# Patient Record
Sex: Male | Born: 1940 | Race: Black or African American | Hispanic: No | Marital: Married | Smoking: Former smoker
Health system: Southern US, Community
[De-identification: ages and names within clinical notes are randomized; demographics above are authoritative.]

## PROBLEM LIST (undated history)

## (undated) DIAGNOSIS — F419 Anxiety disorder, unspecified: Secondary | ICD-10-CM

## (undated) DIAGNOSIS — R634 Abnormal weight loss: Secondary | ICD-10-CM

## (undated) DIAGNOSIS — R630 Anorexia: Secondary | ICD-10-CM

## (undated) DIAGNOSIS — K5732 Diverticulitis of large intestine without perforation or abscess without bleeding: Secondary | ICD-10-CM

## (undated) DIAGNOSIS — J45909 Unspecified asthma, uncomplicated: Secondary | ICD-10-CM

## (undated) DIAGNOSIS — R197 Diarrhea, unspecified: Secondary | ICD-10-CM

## (undated) DIAGNOSIS — F32A Depression, unspecified: Secondary | ICD-10-CM

## (undated) DIAGNOSIS — C799 Secondary malignant neoplasm of unspecified site: Secondary | ICD-10-CM

## (undated) DIAGNOSIS — R51 Headache: Secondary | ICD-10-CM

## (undated) DIAGNOSIS — F329 Major depressive disorder, single episode, unspecified: Secondary | ICD-10-CM

## (undated) DIAGNOSIS — M1990 Unspecified osteoarthritis, unspecified site: Secondary | ICD-10-CM

## (undated) DIAGNOSIS — K219 Gastro-esophageal reflux disease without esophagitis: Secondary | ICD-10-CM

## (undated) DIAGNOSIS — R39198 Other difficulties with micturition: Secondary | ICD-10-CM

## (undated) DIAGNOSIS — R519 Headache, unspecified: Secondary | ICD-10-CM

## (undated) HISTORY — DX: Headache: R51

## (undated) HISTORY — DX: Diverticulitis of large intestine without perforation or abscess without bleeding: K57.32

## (undated) HISTORY — DX: Headache, unspecified: R51.9

## (undated) HISTORY — DX: Unspecified osteoarthritis, unspecified site: M19.90

## (undated) HISTORY — PX: TONSILLECTOMY: SUR1361

## (undated) HISTORY — DX: Abnormal weight loss: R63.4

## (undated) HISTORY — DX: Diarrhea, unspecified: R19.7

## (undated) HISTORY — DX: Other difficulties with micturition: R39.198

---

## 1977-11-16 HISTORY — PX: LAPAROSCOPIC GASTROTOMY W/ REPAIR OF ULCER: SUR772

## 2003-04-09 ENCOUNTER — Encounter: Payer: Self-pay | Admitting: Family Medicine

## 2003-04-09 ENCOUNTER — Encounter: Admission: RE | Admit: 2003-04-09 | Discharge: 2003-04-09 | Payer: Self-pay | Admitting: Family Medicine

## 2004-09-16 ENCOUNTER — Ambulatory Visit: Payer: Self-pay | Admitting: Gastroenterology

## 2005-08-12 ENCOUNTER — Emergency Department (HOSPITAL_COMMUNITY): Admission: EM | Admit: 2005-08-12 | Discharge: 2005-08-12 | Payer: Self-pay | Admitting: Family Medicine

## 2005-08-20 ENCOUNTER — Emergency Department (HOSPITAL_COMMUNITY): Admission: EM | Admit: 2005-08-20 | Discharge: 2005-08-20 | Payer: Self-pay | Admitting: Family Medicine

## 2009-09-11 ENCOUNTER — Encounter (INDEPENDENT_AMBULATORY_CARE_PROVIDER_SITE_OTHER): Payer: Self-pay | Admitting: *Deleted

## 2010-06-03 ENCOUNTER — Telehealth: Payer: Self-pay | Admitting: Gastroenterology

## 2010-07-07 ENCOUNTER — Encounter (INDEPENDENT_AMBULATORY_CARE_PROVIDER_SITE_OTHER): Payer: Self-pay | Admitting: *Deleted

## 2010-07-09 ENCOUNTER — Ambulatory Visit: Payer: Self-pay | Admitting: Gastroenterology

## 2010-12-18 NOTE — Progress Notes (Signed)
Summary: Schedule Colonoscopy  Phone Note Outgoing Call Call back at [facility] Phone [phone]   Call placed by: Harlow Mares CMA Duncan Dull),  June 03, 2010 10:54 AM Call placed to: Patient Summary of Call: Left message on patients machine to call back. patient is due for a colonoscopy  Initial call taken by: Harlow Mares CMA Duncan Dull),  June 03, 2010 10:54 AM  Follow-up for Phone Call        Patient is scheduled for 07-23-10 Follow-up by: es, pcc

## 2010-12-18 NOTE — Miscellaneous (Signed)
Summary: [facility] Previsit/prep  Clinical Lists Changes  Medications: Added new medication of MOVIPREP 100 GM  SOLR (PEG-KCL-NACL-NASULF-NA ASC-C) As per prep instructions. - Signed Rx of MOVIPREP 100 GM  SOLR (PEG-KCL-NACL-NASULF-NA ASC-C) As per prep instructions.;  #1 x 0;  Signed;  Entered by: Wyona Almas RN;  Authorized by: Louis Meckel MD;  Method used: Electronically to [id number] [address], [place], [place], [place]  [address], Ph: [phone], Fax: [phone] Observations: Added new observation of NKA: T (07/09/2010 10:16)    Prescriptions: MOVIPREP 100 GM  SOLR (PEG-KCL-NACL-NASULF-NA ASC-C) As per prep instructions.  #1 x 0   Entered by:   Wyona Almas RN   Authorized by:   Louis Meckel MD   Signed by:   Wyona Almas RN on 07/09/2010   Method used:   [facility]ronically to        [facility] [address] [id number] ([facility]       [address]       [place], [place]  [address]       Ph: [phone]       Fax: [phone]   RxID:   [id number]

## 2010-12-18 NOTE — Letter (Signed)
Summary: [facility] Instructions  [facility]  [address] [place], [place] [address]   Phone: [phone]  Fax: [phone]       RICHARDS PHERIGO    14-Oct-1941    MRN: [id number]        Procedure Day Dorna Bloom:  [facility]  07/23/10     Arrival Time:  10:30AM      Procedure Time:  11:30AM     Location of Procedure:                    _X _  [facility] (4th Floor)                       PREPARATION FOR COLONOSCOPY WITH MOVIPREP   Starting 5 days prior to your procedure 07/18/10 do not eat nuts, seeds, popcorn, corn, beans, peas,  salads, or any raw vegetables.  Do not take any fiber supplements (e.g. Metamucil, Citrucel, and Benefiber).  THE DAY BEFORE YOUR PROCEDURE         DATE: 07/22/10  DAY: TUESDAY  1.  Drink clear liquids the entire day-NO SOLID FOOD  2.  Do not drink anything colored red or purple.  Avoid juices with pulp.  No orange juice.  3.  Drink at least 64 oz. (8 glasses) of fluid/clear liquids during the day to prevent dehydration and help the prep work efficiently.  CLEAR LIQUIDS INCLUDE: Water Jello Ice Popsicles Tea (sugar ok, no milk/cream) Powdered fruit flavored drinks Coffee (sugar ok, no milk/cream) Gatorade Juice: apple, white grape, white cranberry  Lemonade Clear bullion, consomm, broth Carbonated beverages (any kind) Strained chicken noodle soup Hard Candy                             4.  In the morning, mix first dose of MoviPrep solution:    Empty 1 Pouch A and 1 Pouch B into the disposable container    Add lukewarm drinking water to the top line of the container. Mix to dissolve    Refrigerate (mixed solution should be used within 24 hrs)  5.  Begin drinking the prep at 5:00 p.m. The MoviPrep container is divided by 4 marks.   Every 15 minutes drink the solution down to the next mark (approximately 8 oz) until the full liter is complete.   6.  Follow completed prep with 16 oz of clear liquid of your choice  (Nothing red or purple).  Continue to drink clear liquids until bedtime.  7.  Before going to bed, mix second dose of MoviPrep solution:    Empty 1 Pouch A and 1 Pouch B into the disposable container    Add lukewarm drinking water to the top line of the container. Mix to dissolve    Refrigerate  THE DAY OF YOUR PROCEDURE      DATE: 07/23/10   DAY: WEDNESDAY  Beginning at 6:30AM (5 hours before procedure):         1. Every 15 minutes, drink the solution down to the next mark (approx 8 oz) until the full liter is complete.  2. Follow completed prep with 16 oz. of clear liquid of your choice.    3. You may drink clear liquids until 9:30AM (2 HOURS BEFORE PROCEDURE).   MEDICATION INSTRUCTIONS  Unless otherwise instructed, you should take regular prescription medications with a small sip of water   as early as possible the  morning of your procedure.       OTHER INSTRUCTIONS  You will need a responsible adult at least 69 years of age to accompany you and drive you home.   This person must remain in the waiting room during your procedure.  Wear loose fitting clothing that is easily removed.  Leave jewelry and other valuables at home.  However, you may wish to bring a book to read or  an iPod/MP3 player to listen to music as you wait for your procedure to start.  Remove all body piercing jewelry and leave at home.  Total time from sign-in until discharge is approximately 2-3 hours.  You should go home directly after your procedure and rest.  You can resume normal activities the  day after your procedure.  The day of your procedure you should not:   Drive   Make legal decisions   Operate machinery   Drink alcohol   Return to work  You will receive specific instructions about eating, activities and medications before you leave.    The above instructions have been reviewed and explained to me by  Wyona Almas RN  July 09, 2010 10:54 AM     I fully understand  and can verbalize these instructions _____________________________ Date _________

## 2011-10-29 ENCOUNTER — Emergency Department (HOSPITAL_COMMUNITY)
Admission: EM | Admit: 2011-10-29 | Discharge: 2011-10-30 | Disposition: A | Discharge status: Home / Self Care | Payer: Medicare Other | Attending: Emergency Medicine | Admitting: Emergency Medicine

## 2011-10-29 ENCOUNTER — Encounter: Payer: Self-pay | Admitting: *Deleted

## 2011-10-29 DIAGNOSIS — R109 Unspecified abdominal pain: Secondary | ICD-10-CM | POA: Insufficient documentation

## 2011-10-29 DIAGNOSIS — M545 Low back pain, unspecified: Secondary | ICD-10-CM | POA: Insufficient documentation

## 2011-10-29 DIAGNOSIS — N200 Calculus of kidney: Secondary | ICD-10-CM

## 2011-10-29 DIAGNOSIS — R112 Nausea with vomiting, unspecified: Secondary | ICD-10-CM | POA: Insufficient documentation

## 2011-10-29 DIAGNOSIS — K5900 Constipation, unspecified: Secondary | ICD-10-CM | POA: Insufficient documentation

## 2011-10-29 DIAGNOSIS — Z79899 Other long term (current) drug therapy: Secondary | ICD-10-CM | POA: Insufficient documentation

## 2011-10-29 DIAGNOSIS — F341 Dysthymic disorder: Secondary | ICD-10-CM | POA: Insufficient documentation

## 2011-10-29 DIAGNOSIS — N201 Calculus of ureter: Secondary | ICD-10-CM | POA: Insufficient documentation

## 2011-10-29 HISTORY — DX: Anxiety disorder, unspecified: F41.9

## 2011-10-29 HISTORY — DX: Depression, unspecified: F32.A

## 2011-10-29 HISTORY — DX: Major depressive disorder, single episode, unspecified: F32.9

## 2011-10-29 NOTE — ED Notes (Signed)
Pt in c/o left flank pain and lower abd pain, also n/v

## 2011-10-30 ENCOUNTER — Emergency Department (HOSPITAL_COMMUNITY): Payer: Medicare Other

## 2011-10-30 LAB — URINALYSIS, ROUTINE W REFLEX MICROSCOPIC
Glucose, UA: NEGATIVE mg/dL
Ketones, ur: NEGATIVE mg/dL
Leukocytes, UA: NEGATIVE
Nitrite: NEGATIVE
Specific Gravity, Urine: 1.025 (ref 1.005–1.030)
pH: 5.5 (ref 5.0–8.0)

## 2011-10-30 LAB — URINE MICROSCOPIC-ADD ON

## 2011-10-30 MED ORDER — OXYCODONE-ACETAMINOPHEN 5-325 MG PO TABS
1.0000 | ORAL_TABLET | Freq: Once | ORAL | Status: AC
  Administered 2011-10-30: 1 via ORAL
  Filled 2011-10-30: qty 1

## 2011-10-30 MED ORDER — ONDANSETRON HCL 4 MG PO TABS: 4.0000 mg | ORAL_TABLET | Freq: Four times a day (QID) | ORAL | Status: AC

## 2011-10-30 MED ORDER — OXYCODONE-ACETAMINOPHEN 5-325 MG PO TABS: 2.0000 | ORAL_TABLET | ORAL | Status: AC | PRN

## 2011-10-30 MED ORDER — CIPROFLOXACIN HCL 500 MG PO TABS: 500.0000 mg | ORAL_TABLET | Freq: Two times a day (BID) | ORAL | Status: AC

## 2011-10-30 NOTE — ED Provider Notes (Cosign Needed)
History     CSN: [id number] Arrival date & time: 10/29/2011 10:42 PM   First MD Initiated Contact with Patient 10/30/11 0051      Chief Complaint  Patient presents with  . Abdominal Pain  . Flank Pain   very pleasant, gentleman, avid golfer, reports left flank pain for 4-5 days. He has an appointment with his doctor in [facility] tomorrow, but states he "couldn't wait" he did take tramadol for the pain. He also reports being constipated for the past 2 days, but has not taken any over-the-counter remedies. He has had some nausea. No hematuria or dysuria. No numbness, weakness or tingling. No chest pain.  (Consider location/radiation/quality/duration/timing/severity/associated sxs/prior treatment) HPI  Past Medical History  Diagnosis Date  . Depression   . Anxiety     History reviewed. No pertinent past surgical history.  History reviewed. No pertinent family history.  History  Substance Use Topics  . Smoking status: Never Smoker   . Smokeless tobacco: Not on file  . Alcohol Use: No      Review of Systems  All other systems reviewed and are negative.    Allergies  Review of patient's allergies indicates no known allergies.  Home Medications   Current Outpatient Rx  Name Route Sig Dispense Refill  . BUPROPION HCL ER (SR) 150 MG PO TB12 Oral Take 150 mg by mouth 2 (two) times daily.      Marland Kitchen DOXAZOSIN MESYLATE 8 MG PO TABS Oral Take 4 mg by mouth at bedtime.      Marland Kitchen DOXEPIN HCL 50 MG PO CAPS Oral Take 50 mg by mouth at bedtime.      Marland Kitchen PANTOPRAZOLE SODIUM 40 MG PO TBEC Oral Take 40 mg by mouth daily.      . TRAMADOL HCL 50 MG PO TABS Oral Take 50 mg by mouth 3 (three) times daily as needed. Maximum dose= 8 tablets per day       BP 122/72  Pulse 69  Temp(Src) 98.7 F (37.1 C) (Oral)  Resp 20  SpO2 99%  Physical Exam  Nursing note and vitals reviewed. Constitutional: He is oriented to person, place, and time. He appears well-developed and well-nourished.    HENT:  Head: Normocephalic and atraumatic.  Eyes: Conjunctivae and EOM are normal. Pupils are equal, round, and reactive to light.  Neck: Neck supple.  Cardiovascular: Normal rate and regular rhythm.  Exam reveals no gallop and no friction rub.   No murmur heard. Pulmonary/Chest: Breath sounds normal. He has no wheezes. He has no rales. He exhibits no tenderness.  Abdominal: Soft. Bowel sounds are normal. He exhibits no distension. There is no tenderness. There is no rebound and no guarding.  Musculoskeletal: Normal range of motion.       Diffuse tenderness to left lower  paralumbar spine and CVA tenderness. No swelling or ecchymosis.  Neurological: He is alert and oriented to person, place, and time. No cranial nerve deficit. Coordination normal.  Skin: Skin is warm and dry. No rash noted.  Psychiatric: He has a normal mood and affect.    ED Course  Procedures (including critical care time)  Labs Reviewed  URINALYSIS, ROUTINE W REFLEX MICROSCOPIC - Abnormal; Notable for the following:    Hgb urine dipstick SMALL (*)    All other components within normal limits  URINE MICROSCOPIC-ADD ON - Abnormal; Notable for the following:    Crystals URIC ACID CRYSTALS (*)    All other components within normal limits   Ct Abdomen  Pelvis Wo Contrast  10/30/2011  *RADIOLOGY REPORT*  Clinical Data: Left flank pain.  Lower abdominal pain.  Nausea and vomiting.  CT ABDOMEN AND PELVIS WITHOUT CONTRAST  Technique:  Multidetector CT imaging of the abdomen and pelvis was performed following the standard protocol without intravenous contrast.  Comparison: None.  Findings: Atelectasis in the lung bases.  Small left pleural effusion.  4 mm stone in the left ureterovesicle junction with proximal ureterectasis and pyelocaliectasis as well as periureteral and pararenal stranding.  Changes are consistent with moderate obstruction of the kidney and ureter.  There are multiple additional intrarenal stones in the left  kidney.  The right renal collecting system and ureter are decompressed and no right renal or ureteral or bladder stones are visualized.  Surgical clips in the EG junction.  Unenhanced appearance of the liver, spleen, gallbladder, pancreas, adrenal glands, and retroperitoneal lymph nodes is unremarkable.  The stomach and small bowel are decompressed.  Calcification of the abdominal aorta without aneurysm.  Stool filled colon with scattered diverticula. No inflammatory changes suggested.  No significant free fluid or free air in the abdomen.  Anterior midline abdominal wall defects above the emboli this containing fat.  Pelvis:  The appendix is normal.  Diverticulosis of the sigmoid colon without inflammatory process.  There is a focal short segment area of thickening of the wall of the sigmoid colon which could be due to peristalsis but does appear to demonstrates some shouldering.  Colon carcinoma should be excluded.  Mild prostate enlargement.  Calcified phleboliths.  Normal alignment of the lumbar vertebra.  IMPRESSION: 4 mm moderately obstructing stone in the distal left ureter with proximal pyelocaliectasis and ureterectasis, periureteral stranding, and pararenal stranding.  Small left pleural effusion may be reactive.  Focal short segment area of wall thickening in the sigmoid colon is suspicious for possible colon cancer.  Colonic evaluation is recommended.  Original Report Authenticated By: Marlon Pel, M.D.     No diagnosis found.    MDM  Pt is seen and examined;  Initial history and physical completed.  Will follow.      Results for orders placed during the hospital encounter of 10/29/11  URINALYSIS, ROUTINE W REFLEX MICROSCOPIC      Component Value Range   Color, Urine YELLOW  YELLOW    APPearance CLEAR  CLEAR    Specific Gravity, Urine 1.025  1.005 - 1.030    pH 5.5  5.0 - 8.0    Glucose, UA NEGATIVE  NEGATIVE (mg/dL)   Hgb urine dipstick SMALL (*) NEGATIVE    Bilirubin Urine  NEGATIVE  NEGATIVE    Ketones, ur NEGATIVE  NEGATIVE (mg/dL)   Protein, ur NEGATIVE  NEGATIVE (mg/dL)   Urobilinogen, UA 0.2  0.0 - 1.0 (mg/dL)   Nitrite NEGATIVE  NEGATIVE    Leukocytes, UA NEGATIVE  NEGATIVE   URINE MICROSCOPIC-ADD ON      Component Value Range   Squamous Epithelial / LPF RARE  RARE    WBC, UA 0-2  <3 (WBC/hpf)   RBC / HPF 3-6  <3 (RBC/hpf)   Crystals URIC ACID CRYSTALS (*) NEGATIVE    Urine-Other MUCOUS PRESENT     No results found.   4:06 AM  Long delay in CT scan, still pending.   4:06 AM  CT scan discussed in detail with patient and the spouse. Specifically 4, showing a 4 mm distal left ureteral stone. Also, small left pleural effusion, which may be reactive. There was also a short  segment of wall thickening in the sigmoid colon, which the radiologist had mentioned, was suspicious for "possible colon cancer. Patient states he just had a normal colonoscopy. Several weeks ago. However, he was instructed that it would be imperative that he discuss his CAT scan findings with his primary care physician and take appropriate steps to followup with [place] as needed. Patient states he would be seeing his primary doctor, tomorrow and will discuss things further   Theron Arista A. Patrica Duel, MD 10/30/11 1610

## 2011-10-30 NOTE — ED Notes (Signed)
Returned from CT.

## 2011-10-30 NOTE — ED Notes (Signed)
Waiting for CT scan

## 2012-07-13 ENCOUNTER — Inpatient Hospital Stay (HOSPITAL_COMMUNITY)
Admission: EM | Admit: 2012-07-13 | Discharge: 2012-07-17 | DRG: 392 | Disposition: A | Discharge status: Home / Self Care | Payer: Medicare Other | Attending: Surgery | Admitting: Surgery

## 2012-07-13 ENCOUNTER — Encounter (HOSPITAL_COMMUNITY): Payer: Self-pay | Admitting: Family Medicine

## 2012-07-13 ENCOUNTER — Emergency Department (HOSPITAL_COMMUNITY): Payer: Medicare Other

## 2012-07-13 DIAGNOSIS — K631 Perforation of intestine (nontraumatic): Secondary | ICD-10-CM

## 2012-07-13 DIAGNOSIS — K630 Abscess of intestine: Secondary | ICD-10-CM | POA: Diagnosis present

## 2012-07-13 DIAGNOSIS — K219 Gastro-esophageal reflux disease without esophagitis: Secondary | ICD-10-CM | POA: Diagnosis present

## 2012-07-13 DIAGNOSIS — Z79899 Other long term (current) drug therapy: Secondary | ICD-10-CM

## 2012-07-13 DIAGNOSIS — K5780 Diverticulitis of intestine, part unspecified, with perforation and abscess without bleeding: Secondary | ICD-10-CM | POA: Diagnosis present

## 2012-07-13 DIAGNOSIS — K5732 Diverticulitis of large intestine without perforation or abscess without bleeding: Secondary | ICD-10-CM

## 2012-07-13 DIAGNOSIS — F3289 Other specified depressive episodes: Secondary | ICD-10-CM | POA: Diagnosis present

## 2012-07-13 DIAGNOSIS — F329 Major depressive disorder, single episode, unspecified: Secondary | ICD-10-CM | POA: Diagnosis present

## 2012-07-13 DIAGNOSIS — F411 Generalized anxiety disorder: Secondary | ICD-10-CM | POA: Diagnosis present

## 2012-07-13 DIAGNOSIS — K5720 Diverticulitis of large intestine with perforation and abscess without bleeding: Secondary | ICD-10-CM

## 2012-07-13 LAB — CBC WITH DIFFERENTIAL/PLATELET
Basophils Absolute: 0 10*3/uL (ref 0.0–0.1)
HCT: 38.3 % — ABNORMAL LOW (ref 39.0–52.0)
Lymphocytes Relative: 13 % (ref 12–46)
Monocytes Absolute: 0.8 10*3/uL (ref 0.1–1.0)
Neutro Abs: 6.5 10*3/uL (ref 1.7–7.7)
Neutrophils Relative %: 77 % (ref 43–77)
Platelets: 271 10*3/uL (ref 150–400)
RDW: 12.7 % (ref 11.5–15.5)
WBC: 8.5 10*3/uL (ref 4.0–10.5)

## 2012-07-13 LAB — POCT I-STAT, CHEM 8
BUN: 11 mg/dL (ref 6–23)
Chloride: 104 mEq/L (ref 96–112)
Potassium: 3.6 mEq/L (ref 3.5–5.1)
Sodium: 142 mEq/L (ref 135–145)

## 2012-07-13 LAB — URINALYSIS, ROUTINE W REFLEX MICROSCOPIC
Nitrite: NEGATIVE
Specific Gravity, Urine: 1.032 — ABNORMAL HIGH (ref 1.005–1.030)
pH: 5.5 (ref 5.0–8.0)

## 2012-07-13 LAB — GLUCOSE, CAPILLARY

## 2012-07-13 MED ORDER — DIPHENHYDRAMINE HCL 50 MG/ML IJ SOLN: 12.5000 mg | Freq: Four times a day (QID) | INTRAMUSCULAR | Status: DC | PRN

## 2012-07-13 MED ORDER — DOXAZOSIN MESYLATE 4 MG PO TABS
4.0000 mg | ORAL_TABLET | Freq: Every day | ORAL | Status: DC
  Administered 2012-07-13 – 2012-07-16 (×4): 4 mg via ORAL
  Filled 2012-07-13 (×5): qty 1

## 2012-07-13 MED ORDER — ENOXAPARIN SODIUM 40 MG/0.4ML ~~LOC~~ SOLN
40.0000 mg | SUBCUTANEOUS | Status: DC
  Administered 2012-07-14 – 2012-07-16 (×3): 40 mg via SUBCUTANEOUS
  Filled 2012-07-13 (×6): qty 0.4

## 2012-07-13 MED ORDER — KCL IN DEXTROSE-NACL 20-5-0.45 MEQ/L-%-% IV SOLN
INTRAVENOUS | Status: DC
  Administered 2012-07-13: 22:00:00 via INTRAVENOUS
  Administered 2012-07-14: 100 mL/h via INTRAVENOUS
  Administered 2012-07-15 (×2): via INTRAVENOUS
  Filled 2012-07-13 (×8): qty 1000

## 2012-07-13 MED ORDER — CIPROFLOXACIN IN D5W 400 MG/200ML IV SOLN
400.0000 mg | Freq: Two times a day (BID) | INTRAVENOUS | Status: DC
  Administered 2012-07-13 – 2012-07-16 (×7): 400 mg via INTRAVENOUS
  Filled 2012-07-13 (×8): qty 200

## 2012-07-13 MED ORDER — ERTAPENEM SODIUM 1 G IJ SOLR
1.0000 g | Freq: Once | INTRAMUSCULAR | Status: AC
  Administered 2012-07-13: 1 g via INTRAVENOUS
  Filled 2012-07-13: qty 1

## 2012-07-13 MED ORDER — ONDANSETRON HCL 4 MG/2ML IJ SOLN: 4.0000 mg | Freq: Four times a day (QID) | INTRAMUSCULAR | Status: DC | PRN

## 2012-07-13 MED ORDER — MORPHINE SULFATE 2 MG/ML IJ SOLN
1.0000 mg | INTRAMUSCULAR | Status: DC | PRN
  Administered 2012-07-14 – 2012-07-15 (×3): 2 mg via INTRAVENOUS
  Filled 2012-07-13 (×4): qty 1

## 2012-07-13 MED ORDER — ACETAMINOPHEN 650 MG RE SUPP: 650.0000 mg | Freq: Four times a day (QID) | RECTAL | Status: DC | PRN

## 2012-07-13 MED ORDER — PANTOPRAZOLE SODIUM 40 MG IV SOLR
40.0000 mg | Freq: Every day | INTRAVENOUS | Status: DC
  Administered 2012-07-13 – 2012-07-16 (×4): 40 mg via INTRAVENOUS
  Filled 2012-07-13 (×5): qty 40

## 2012-07-13 MED ORDER — METRONIDAZOLE IN NACL 5-0.79 MG/ML-% IV SOLN
500.0000 mg | Freq: Three times a day (TID) | INTRAVENOUS | Status: DC
  Administered 2012-07-13 – 2012-07-17 (×11): 500 mg via INTRAVENOUS
  Filled 2012-07-13 (×12): qty 100

## 2012-07-13 MED ORDER — DIPHENHYDRAMINE HCL 12.5 MG/5ML PO ELIX: 12.5000 mg | ORAL_SOLUTION | Freq: Four times a day (QID) | ORAL | Status: DC | PRN

## 2012-07-13 MED ORDER — ACETAMINOPHEN 325 MG PO TABS
650.0000 mg | ORAL_TABLET | Freq: Four times a day (QID) | ORAL | Status: DC | PRN
  Administered 2012-07-13 – 2012-07-16 (×2): 650 mg via ORAL
  Filled 2012-07-13 (×4): qty 2

## 2012-07-13 MED ORDER — DOXEPIN HCL 50 MG PO CAPS
50.0000 mg | ORAL_CAPSULE | Freq: Every day | ORAL | Status: DC
  Administered 2012-07-13 – 2012-07-16 (×4): 50 mg via ORAL
  Filled 2012-07-13 (×5): qty 1

## 2012-07-13 NOTE — ED Notes (Addendum)
Pt reports L flank and abd pain since yesterday with worse pain today. Pt denies dysuria or constipation, states last BM today.

## 2012-07-13 NOTE — ED Notes (Signed)
Patient transported to CT 

## 2012-07-13 NOTE — H&P (Signed)
Daniel Huynh is an 71 y.o. male.   Chief Complaint: left flank/abdominal pain. HPI:  Pt is a 71 year old male with 2 weeks of left flank/abdominal pain.  It was mild until 2 days ago.  It became much more severe.  He has not had much of an appetite.  The pain felt like the kidney stone he had last year.  He denies hematuria.  He has had a colonoscopy within the last 5 years.  He does not recall being told he had diverticuli.  He denies fevers/ chills.  The pain when he came in was very severe.  It was not relieved by BM.    Past Medical History  Diagnosis Date  . Depression   . Anxiety     History reviewed. No pertinent past surgical history.  History reviewed. No pertinent family history. Social History:  reports that he has never smoked. He does not have any smokeless tobacco history on file. He reports that he does not drink alcohol or use illicit drugs.  Allergies:  Allergies  Allergen Reactions  . Tylenol With Codeine #3 (Acetaminophen-Codeine) Rash     (Not in a hospital admission)  Results for orders placed during the hospital encounter of 07/13/12 (from the past 48 hour(s))  URINALYSIS, ROUTINE W REFLEX MICROSCOPIC     Status: Abnormal   Collection Time   07/13/12  3:38 PM      Component Value Range Comment   Color, Urine AMBER (*) YELLOW BIOCHEMICALS MAY BE AFFECTED BY COLOR   APPearance CLEAR  CLEAR    Specific Gravity, Urine 1.032 (*) 1.005 - 1.030    pH 5.5  5.0 - 8.0    Glucose, UA NEGATIVE  NEGATIVE mg/dL    Hgb urine dipstick NEGATIVE  NEGATIVE    Bilirubin Urine SMALL (*) NEGATIVE    Ketones, ur NEGATIVE  NEGATIVE mg/dL    Protein, ur NEGATIVE  NEGATIVE mg/dL    Urobilinogen, UA 1.0  0.0 - 1.0 mg/dL    Nitrite NEGATIVE  NEGATIVE    Leukocytes, UA NEGATIVE  NEGATIVE MICROSCOPIC NOT DONE ON URINES WITH NEGATIVE PROTEIN, BLOOD, LEUKOCYTES, NITRITE, OR GLUCOSE <1000 mg/dL.  CBC WITH DIFFERENTIAL     Status: Abnormal   Collection Time   07/13/12  3:45 PM   Component Value Range Comment   WBC 8.5  4.0 - 10.5 K/uL    RBC 4.63  4.22 - 5.81 MIL/uL    Hemoglobin 12.8 (*) 13.0 - 17.0 g/dL    HCT 09.8 (*) 11.9 - 52.0 %    MCV 82.7  78.0 - 100.0 fL    MCH 27.6  26.0 - 34.0 pg    MCHC 33.4  30.0 - 36.0 g/dL    RDW 14.7  82.9 - 56.2 %    Platelets 271  150 - 400 K/uL    Neutrophils Relative 77  43 - 77 %    Neutro Abs 6.5  1.7 - 7.7 K/uL    Lymphocytes Relative 13  12 - 46 %    Lymphs Abs 1.1  0.7 - 4.0 K/uL    Monocytes Relative 10  3 - 12 %    Monocytes Absolute 0.8  0.1 - 1.0 K/uL    Eosinophils Relative 1  0 - 5 %    Eosinophils Absolute 0.0  0.0 - 0.7 K/uL    Basophils Relative 0  0 - 1 %    Basophils Absolute 0.0  0.0 - 0.1 K/uL  GLUCOSE, CAPILLARY     Status: Abnormal   Collection Time   07/13/12  3:51 PM      Component Value Range Comment   Glucose-Capillary 135 (*) 70 - 99 mg/dL    Comment 1 Notify RN     POCT I-STAT, CHEM 8     Status: Abnormal   Collection Time   07/13/12  4:32 PM      Component Value Range Comment   Sodium 142  135 - 145 mEq/L    Potassium 3.6  3.5 - 5.1 mEq/L    Chloride 104  96 - 112 mEq/L    BUN 11  6 - 23 mg/dL    Creatinine, Ser 5.64  0.50 - 1.35 mg/dL    Glucose, Bld 332 (*) 70 - 99 mg/dL    Calcium, Ion 9.51  8.84 - 1.30 mmol/L    TCO2 26  0 - 100 mmol/L    Hemoglobin 13.6  13.0 - 17.0 g/dL    HCT 16.6  06.3 - 01.6 %    Ct Abdomen Pelvis Wo Contrast  07/13/2012  *RADIOLOGY REPORT*  Clinical Data: Left flank pain.  Worsening pain today.  CT ABDOMEN AND PELVIS WITHOUT CONTRAST  Technique:  Multidetector CT imaging of the abdomen and pelvis was performed following the standard protocol without intravenous contrast.  Comparison: 10/30/2011.  Findings: Lung bases demonstrate dependent atelectasis.  Probable bilateral trace pleural effusions.  Postoperative changes are noted at the gastroesophageal junction, likely for reflux surgery.  Small amount of anterior pericardial fluid or thickening. Unenhanced CT  was performed per clinician order.  Lack of IV contrast limits sensitivity and specificity, especially for evaluation of abdominal/pelvic solid viscera.  Liver grossly normal.  High density material is present in the dependent gallbladder, likely representing biliary sludge.  Adrenal glands appear normal.  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  Bilateral nonobstructing renal calculi are present.  The ureters appear normal.  Aortoiliac atherosclerosis.  Urinary bladder normal.  Prostatomegaly.  There are marked inflammatory changes in the left mid abdomen secondary to diverticulitis.  There is a posterior contained perforation (image number 32 series 2).  Phlegmon surrounds this area in the retroperitoneal fat. Area of probable extraluminal gas and feculent material measures 23 mm x 12 mm on image number 32. This is larger than typical colonic diverticula.  Tiny area of apparently extraluminal retroperitoneal gas is present on image number 41 as well.  There is no intra-abdominal free air.  The distal colon demonstrates diverticulosis without diverticulitis.  Small bowel grossly appears within normal limits.  No aggressive osseous lesions.  IMPRESSION: 1.  Descending colonic diverticulitis.  Contained perforation into the posterior retroperitoneal fat with surrounding phlegmon. 2.  Bilateral nonobstructing renal collecting system calculi. 3.  Postsurgical changes at the gastroesophageal junction. 4.  Atherosclerosis.  These results were called by telephone on 07/13/2012 at 1720 hours to physician's assistant [facility], who verbally acknowledged these results.   Original Report Authenticated By: Andreas Newport, M.D.     Review of Systems  Constitutional: Negative for fever, chills and diaphoresis.  HENT: Negative.   Eyes: Negative.   Respiratory: Negative.   Cardiovascular: Negative.   Gastrointestinal:  Positive for abdominal pain (L flank). Negative for nausea, vomiting, diarrhea, constipation and blood in stool.  Genitourinary: Positive for flank pain.  Musculoskeletal: Negative.   Skin: Negative.   Neurological: Negative.   Endo/Heme/Allergies: Negative.  Psychiatric/Behavioral: Negative.     Blood pressure 134/88, pulse 78, temperature 98.9 F (37.2 C), temperature source Oral, resp. rate 24, SpO2 98.00%. Physical Exam   Assessment/Plan Acute diverticulitis with contained perforation and abscess.  Bowel rest. IVF IV antbiotics IR consult for perc drain. Hope to resolve without acute surgical intervention.  The possible courses of diverticulitis were reviewed with the patient.    Ritchie Klee 07/13/2012, 8:38 PM

## 2012-07-13 NOTE — ED Provider Notes (Signed)
History     CSN: [id number]  Arrival date & time 07/13/12  1505   First MD Initiated Contact with Patient 07/13/12 1537      Chief Complaint  Patient presents with  . Abdominal Pain    (Consider location/radiation/quality/duration/timing/severity/associated sxs/prior treatment) HPI Comments: Daniel Huynh is a 71 y.o. Male who presents with left flank pain for several days. States pain radiates into left lower abdomen. No pain with uriantion. No hematuria. No n/v/d. Last BM today and normal, loose. Hx of same pain last year, was diagnosed with a kidney stone. No fever, chills, malaise. Pain is colicky, comes and goes.   The history is provided by the patient.    Past Medical History  Diagnosis Date  . Depression   . Anxiety     History reviewed. No pertinent past surgical history.  History reviewed. No pertinent family history.  History  Substance Use Topics  . Smoking status: Never Smoker   . Smokeless tobacco: Not on file  . Alcohol Use: No      Review of Systems  Constitutional: Negative for fever and chills.  HENT: Negative for neck pain and neck stiffness.   Respiratory: Negative.   Cardiovascular: Negative.   Gastrointestinal: Positive for abdominal pain. Negative for nausea and vomiting.  Genitourinary: Positive for flank pain. Negative for dysuria, urgency, frequency and hematuria.  Musculoskeletal: Positive for back pain.  Skin: Negative.   Neurological: Negative for dizziness, weakness, light-headedness and headaches.    Allergies  Tylenol with codeine #3  Home Medications   Current Outpatient Rx  Name Route Sig Dispense Refill  . DOXAZOSIN MESYLATE 8 MG PO TABS Oral Take 4 mg by mouth at bedtime.      Marland Kitchen DOXEPIN HCL 50 MG PO CAPS Oral Take 50 mg by mouth at bedtime.        BP 120/72  Pulse 78  Temp 98.9 F (37.2 C) (Oral)  Resp 25  SpO2 96%  Physical Exam  Nursing note and vitals reviewed. Constitutional: He is oriented to person,  place, and time. He appears well-developed and well-nourished. No distress.  HENT:  Head: Normocephalic.  Eyes: Conjunctivae are normal.  Neck: Neck supple.  Cardiovascular: Normal rate, regular rhythm and normal heart sounds.   Pulmonary/Chest: Effort normal and breath sounds normal. No respiratory distress. He has no wheezes. He has no rales.  Abdominal: Soft. Bowel sounds are normal. He exhibits no distension. There is tenderness. There is no rebound and no guarding.       LUQ tenderness. Left flank tenderness, no CVA tenderness with percussion  Musculoskeletal: He exhibits no edema.  Neurological: He is alert and oriented to person, place, and time.  Skin: Skin is warm and dry.  Psychiatric: He has a normal mood and affect.    ED Course  Procedures (including critical care time)  Left flank pain, abdomen soft, only mild tenderness to palpation, no guarding, no rebound, no surgical abdomen. Hx of kidney stone, will get non contrasted CT for further evaluation.   Results for orders placed during the hospital encounter of 07/13/12  URINALYSIS, ROUTINE W REFLEX MICROSCOPIC      Component Value Range   Color, Urine AMBER (*) YELLOW   APPearance CLEAR  CLEAR   Specific Gravity, Urine 1.032 (*) 1.005 - 1.030   pH 5.5  5.0 - 8.0   Glucose, UA NEGATIVE  NEGATIVE mg/dL   Hgb urine dipstick NEGATIVE  NEGATIVE   Bilirubin Urine SMALL (*) NEGATIVE  Ketones, ur NEGATIVE  NEGATIVE mg/dL   Protein, ur NEGATIVE  NEGATIVE mg/dL   Urobilinogen, UA 1.0  0.0 - 1.0 mg/dL   Nitrite NEGATIVE  NEGATIVE   Leukocytes, UA NEGATIVE  NEGATIVE  GLUCOSE, CAPILLARY      Component Value Range   Glucose-Capillary 135 (*) 70 - 99 mg/dL   Comment 1 Notify RN    CBC WITH DIFFERENTIAL      Component Value Range   WBC 8.5  4.0 - 10.5 K/uL   RBC 4.63  4.22 - 5.81 MIL/uL   Hemoglobin 12.8 (*) 13.0 - 17.0 g/dL   HCT 16.1 (*) 09.6 - 04.5 %   MCV 82.7  78.0 - 100.0 fL   MCH 27.6  26.0 - 34.0 pg   MCHC 33.4   30.0 - 36.0 g/dL   RDW 40.9  81.1 - 91.4 %   Platelets 271  150 - 400 K/uL   Neutrophils Relative 77  43 - 77 %   Neutro Abs 6.5  1.7 - 7.7 K/uL   Lymphocytes Relative 13  12 - 46 %   Lymphs Abs 1.1  0.7 - 4.0 K/uL   Monocytes Relative 10  3 - 12 %   Monocytes Absolute 0.8  0.1 - 1.0 K/uL   Eosinophils Relative 1  0 - 5 %   Eosinophils Absolute 0.0  0.0 - 0.7 K/uL   Basophils Relative 0  0 - 1 %   Basophils Absolute 0.0  0.0 - 0.1 K/uL  POCT I-STAT, CHEM 8      Component Value Range   Sodium 142  135 - 145 mEq/L   Potassium 3.6  3.5 - 5.1 mEq/L   Chloride 104  96 - 112 mEq/L   BUN 11  6 - 23 mg/dL   Creatinine, Ser 7.82  0.50 - 1.35 mg/dL   Glucose, Bld 956 (*) 70 - 99 mg/dL   Calcium, Ion 2.13  0.86 - 1.30 mmol/L   TCO2 26  0 - 100 mmol/L   Hemoglobin 13.6  13.0 - 17.0 g/dL   HCT 57.8  46.9 - 62.9 %   Ct Abdomen Pelvis Wo Contrast  07/13/2012  *RADIOLOGY REPORT*  Clinical Data: Left flank pain.  Worsening pain today.  CT ABDOMEN AND PELVIS WITHOUT CONTRAST  Technique:  Multidetector CT imaging of the abdomen and pelvis was performed following the standard protocol without intravenous contrast.  Comparison: 10/30/2011.  Findings: Lung bases demonstrate dependent atelectasis.  Probable bilateral trace pleural effusions.  Postoperative changes are noted at the gastroesophageal junction, likely for reflux surgery.  Small amount of anterior pericardial fluid or thickening. Unenhanced CT was performed per clinician order.  Lack of IV contrast limits sensitivity and specificity, especially for evaluation of abdominal/pelvic solid viscera.  Liver grossly normal.  High density material is present in the dependent gallbladder, likely representing biliary sludge.  Adrenal glands appear normal.  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  Bilateral nonobstructing renal calculi are present.  The ureters  appear normal.  Aortoiliac atherosclerosis.  Urinary bladder normal.  Prostatomegaly.  There are marked inflammatory changes in the left mid abdomen secondary to diverticulitis.  There is a posterior contained perforation (image number 32 series 2).  Phlegmon surrounds this area in the retroperitoneal fat. Area of probable extraluminal gas and feculent material measures 23 mm x 12 mm on image number 32. This is larger than  typical colonic diverticula.  Tiny area of apparently extraluminal retroperitoneal gas is present on image number 41 as well.  There is no intra-abdominal free air.  The distal colon demonstrates diverticulosis without diverticulitis.  Small bowel grossly appears within normal limits.  No aggressive osseous lesions.  IMPRESSION: 1.  Descending colonic diverticulitis.  Contained perforation into the posterior retroperitoneal fat with surrounding phlegmon. 2.  Bilateral nonobstructing renal collecting system calculi. 3.  Postsurgical changes at the gastroesophageal junction. 4.  Atherosclerosis.  These results were called by telephone on 07/13/2012 at 1720 hours to physician's assistant [facility], who verbally acknowledged these results.   Original Report Authenticated By: Andreas Newport, M.D.     5:42 PM CT showed contained perforation of diverticulitis. Invanz started. Signed out at shift change, PA Lawyer to make admission call. Pt remains in NAD. VS normal. Abdomen continues to be soft, non surgical at this time.   Filed Vitals:   07/13/12 1515  BP: 120/72  Pulse: 78  Temp: 98.9 F (37.2 C)  Resp: 25     No diagnosis found.    MDM          Lottie Mussel, PA 07/13/12 1745

## 2012-07-13 NOTE — ED Provider Notes (Signed)
Medical screening examination/treatment/procedure(s) were performed by non-physician practitioner and as supervising physician I was immediately available for consultation/collaboration.   Kristapher [place] H Kailena Lubas, MD 07/13/12 2001 

## 2012-07-13 NOTE — ED Notes (Signed)
Pt reports left flank pain and lower abdominal pain. States flank pain started today and abdomen has been x1 week. Denies N/V/D. Denies urinary problems. LBM today and normal for him.

## 2012-07-13 NOTE — ED Notes (Signed)
Report to floor attempted. Unable to take report at this time.

## 2012-07-14 ENCOUNTER — Encounter (HOSPITAL_COMMUNITY): Payer: Self-pay | Admitting: General Surgery

## 2012-07-14 DIAGNOSIS — F32A Depression, unspecified: Secondary | ICD-10-CM | POA: Insufficient documentation

## 2012-07-14 DIAGNOSIS — K5780 Diverticulitis of intestine, part unspecified, with perforation and abscess without bleeding: Secondary | ICD-10-CM | POA: Diagnosis present

## 2012-07-14 DIAGNOSIS — F419 Anxiety disorder, unspecified: Secondary | ICD-10-CM | POA: Insufficient documentation

## 2012-07-14 DIAGNOSIS — F329 Major depressive disorder, single episode, unspecified: Secondary | ICD-10-CM | POA: Insufficient documentation

## 2012-07-14 LAB — BASIC METABOLIC PANEL
BUN: 9 mg/dL (ref 6–23)
Chloride: 106 mEq/L (ref 96–112)
Creatinine, Ser: 1.12 mg/dL (ref 0.50–1.35)
Glucose, Bld: 136 mg/dL — ABNORMAL HIGH (ref 70–99)
Potassium: 3.7 mEq/L (ref 3.5–5.1)

## 2012-07-14 LAB — CBC
HCT: 34.6 % — ABNORMAL LOW (ref 39.0–52.0)
Hemoglobin: 11.6 g/dL — ABNORMAL LOW (ref 13.0–17.0)
MCH: 27.7 pg (ref 26.0–34.0)
MCHC: 33.5 g/dL (ref 30.0–36.0)
RDW: 12.6 % (ref 11.5–15.5)

## 2012-07-14 MED ORDER — DIPHENHYDRAMINE HCL 50 MG/ML IJ SOLN: 12.5000 mg | Freq: Four times a day (QID) | INTRAMUSCULAR | Status: DC | PRN

## 2012-07-14 MED ORDER — GABAPENTIN 100 MG PO CAPS: 100.0000 mg | ORAL_CAPSULE | ORAL | Status: DC

## 2012-07-14 MED ORDER — BUPROPION HCL ER (XL) 150 MG PO TB24
150.0000 mg | ORAL_TABLET | Freq: Every day | ORAL | Status: DC
  Administered 2012-07-14 – 2012-07-17 (×3): 150 mg via ORAL
  Filled 2012-07-14 (×4): qty 1

## 2012-07-14 MED ORDER — BUPROPION HCL ER (SR) 150 MG PO TB12: 150.0000 mg | ORAL_TABLET | Freq: Every day | ORAL | Status: DC

## 2012-07-14 MED ORDER — TESTOSTERONE 20.25 MG/ACT (1.62%) TD GEL
2.0000 "application " | Freq: Every day | TRANSDERMAL | Status: DC
  Administered 2012-07-14: 2 via TRANSDERMAL
  Filled 2012-07-14: qty 1

## 2012-07-14 MED ORDER — ALUM & MAG HYDROXIDE-SIMETH 200-200-20 MG/5ML PO SUSP: 30.0000 mL | Freq: Four times a day (QID) | ORAL | Status: DC | PRN

## 2012-07-14 MED ORDER — MAGIC MOUTHWASH
15.0000 mL | Freq: Four times a day (QID) | ORAL | Status: DC | PRN
  Filled 2012-07-14: qty 15

## 2012-07-14 MED ORDER — GABAPENTIN 100 MG PO CAPS
100.0000 mg | ORAL_CAPSULE | Freq: Every day | ORAL | Status: DC
  Administered 2012-07-14 – 2012-07-17 (×14): 100 mg via ORAL
  Filled 2012-07-14 (×18): qty 1

## 2012-07-14 MED ORDER — LACTATED RINGERS IV BOLUS (SEPSIS): 1000.0000 mL | Freq: Three times a day (TID) | INTRAVENOUS | Status: AC | PRN

## 2012-07-14 MED ORDER — ACETAMINOPHEN 325 MG PO TABS
650.0000 mg | ORAL_TABLET | Freq: Four times a day (QID) | ORAL | Status: DC
  Administered 2012-07-14 – 2012-07-16 (×7): 650 mg via ORAL
  Filled 2012-07-14 (×9): qty 2

## 2012-07-14 NOTE — Progress Notes (Signed)
Cont IV ABX & follow.  If worsens or no improvement - CT in 3-5 days to r/o new abscess Avoid adv diet with pain.

## 2012-07-14 NOTE — Progress Notes (Signed)
Subjective: Sitting up in chair, tender LUQ more behind his ribs  Objective: Vital signs in last 24 hours: Temp:  [98.2 F (36.8 C)-99.3 F (37.4 C)] 98.3 F (36.8 C) (08/29 0600) Pulse Rate:  [52-78] 52  (08/29 0200) Resp:  [18-25] 18  (08/29 0600) BP: (106-134)/(69-88) 108/69 mmHg (08/29 0600) SpO2:  [96 %-99 %] 99 % (08/29 0600) Weight:  [72 kg (158 lb 11.7 oz)] 72 kg (158 lb 11.7 oz) (08/28 2200)  NPO, tM 99.3,vss, LABS ok wbc is still normal.    Intake/Output from previous day: 08/28 0701 - 08/29 0700 In: 858.3 [I.V.:858.3] Out: 850 [Urine:850] Intake/Output this shift:    General appearance: alert, cooperative and no distress Resp: clear to auscultation bilaterally GI: soft, not distended, tenderLUQ more towards posterior ribs.  Lab Results:   Basename 07/14/12 0340 07/13/12 1632 07/13/12 1545  WBC 7.9 -- 8.5  HGB 11.6* 13.6 --  HCT 34.6* 40.0 --  PLT 243 -- 271    BMET  Basename 07/14/12 0340 07/13/12 1632  NA 139 142  K 3.7 3.6  CL 106 104  CO2 27 --  GLUCOSE 136* 129*  BUN 9 11  CREATININE 1.12 1.20  CALCIUM 8.8 --   PT/INR No results found for this basename: LABPROT:2,INR:2 in the last 72 hours  No results found for this basename: AST:5,ALT:5,ALKPHOS:5,BILITOT:5,PROT:5,ALBUMIN:5 in the last 168 hours   Lipase  No results found for this basename: lipase     Studies/Results: Ct Abdomen Pelvis Wo Contrast  07/13/2012  *RADIOLOGY REPORT*  Clinical Data: Left flank pain.  Worsening pain today.  CT ABDOMEN AND PELVIS WITHOUT CONTRAST  Technique:  Multidetector CT imaging of the abdomen and pelvis was performed following the standard protocol without intravenous contrast.  Comparison: 10/30/2011.  Findings: Lung bases demonstrate dependent atelectasis.  Probable bilateral trace pleural effusions.  Postoperative changes are noted at the gastroesophageal junction, likely for reflux surgery.  Small amount of anterior pericardial fluid or thickening.  Unenhanced CT was performed per clinician order.  Lack of IV contrast limits sensitivity and specificity, especially for evaluation of abdominal/pelvic solid viscera.  Liver grossly normal.  High density material is present in the dependent gallbladder, likely representing biliary sludge.  Adrenal glands appear normal.  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  Bilateral nonobstructing renal calculi are present.  The ureters appear normal.  Aortoiliac atherosclerosis.  Urinary bladder normal.  Prostatomegaly.  There are marked inflammatory changes in the left mid abdomen secondary to diverticulitis.  There is a posterior contained perforation (image number 32 series 2).  Phlegmon surrounds this area in the retroperitoneal fat. Area of probable extraluminal gas and feculent material measures 23 mm x 12 mm on image number 32. This is larger than typical colonic diverticula.  Tiny area of apparently extraluminal retroperitoneal gas is present on image number 41 as well.  There is no intra-abdominal free air.  The distal colon demonstrates diverticulosis without diverticulitis.  Small bowel grossly appears within normal limits.  No aggressive osseous lesions.  IMPRESSION: 1.  Descending colonic diverticulitis.  Contained perforation into the posterior retroperitoneal fat with surrounding phlegmon. 2.  Bilateral nonobstructing renal collecting system calculi. 3.  Postsurgical changes at the gastroesophageal junction. 4.  Atherosclerosis.  These results were called by telephone on 07/13/2012 at 1720 hours to physician's assistant [facility], who verbally acknowledged these results.   Original Report Authenticated By: Andreas Newport, M.D.  Medications:    . ciprofloxacin  400 mg Intravenous Q12H  . doxazosin  4 mg Oral QHS  . doxepin  50 mg Oral QHS  . enoxaparin  40 mg Subcutaneous Q24H  . ertapenem  1 g  Intravenous Once  . metronidazole  500 mg Intravenous Q8H  . pantoprazole (PROTONIX) IV  40 mg Intravenous QHS    Assessment/Plan Acute diverticulitis with contained perforation and abscess. Hx Depression  Hx  Anxiety  Hx. Gerd   Plan:  Antibiotics, IR to see, try to get him on home meds for depression with sips. Hydrate. IR reviewed the study from last PM, they are concerned this is a very large diverticulum, not an abscess.  They do not recommend drain at this time.    LOS: 1 day    Ashlee Player 07/14/2012

## 2012-07-14 NOTE — Care Management Note (Signed)
    Page 1 of 1   07/19/2012     8:37:38 AM   CARE MANAGEMENT NOTE 07/19/2012  Patient:  Daniel Huynh, Daniel Huynh   Account Number:  [id number]  Date Initiated:  07/14/2012  Documentation initiated by:  Lorenda Ishihara  Subjective/Objective Assessment:   71 yo male admitted with diverticular abscess. PTA lived at home with spouse     Action/Plan:   Anticipated DC Date:  07/18/2012   Anticipated DC Plan:  HOME/SELF CARE      DC Planning Services  CM consult      Choice offered to / List presented to:             Status of service:  Completed, signed off Medicare Important Message given?   (If response is "NO", the following Medicare IM given date fields will be blank) Date Medicare IM given:   Date Additional Medicare IM given:    Discharge Disposition:  HOME/SELF CARE  Per UR Regulation:  Reviewed for med. necessity/level of care/duration of stay  If discussed at Long Length of Stay Meetings, dates discussed:    Comments:

## 2012-07-15 DIAGNOSIS — K5732 Diverticulitis of large intestine without perforation or abscess without bleeding: Secondary | ICD-10-CM

## 2012-07-15 DIAGNOSIS — K631 Perforation of intestine (nontraumatic): Secondary | ICD-10-CM

## 2012-07-15 LAB — CBC
HCT: 34 % — ABNORMAL LOW (ref 39.0–52.0)
Hemoglobin: 11.4 g/dL — ABNORMAL LOW (ref 13.0–17.0)
MCH: 27.9 pg (ref 26.0–34.0)
MCHC: 33.5 g/dL (ref 30.0–36.0)
MCV: 83.1 fL (ref 78.0–100.0)

## 2012-07-15 NOTE — Progress Notes (Signed)
Subjective: Pt doing well.  Decreased abd pain.  Pt is tolerating sips of clears.  No diarrhea.  Pt con't to ambulate on the ward.  Objective: Vital signs in last 24 hours: Temp:  [97.4 F (36.3 C)-98.7 F (37.1 C)] 97.4 F (36.3 C) (08/30 0600) Pulse Rate:  [47-62] 47  (08/30 0600) Resp:  [18-20] 18  (08/30 0600) BP: (102-123)/(63-75) 114/67 mmHg (08/30 0600) SpO2:  [97 %-100 %] 100 % (08/30 0600)    Intake/Output from previous day: 08/29 0701 - 08/30 0700 In: 1545 [P.O.:360; I.V.:1185] Out: 1600 [Urine:1600] Intake/Output this shift: Total I/O In: -  Out: 450 [Urine:450]  Physical Exam Gen: NAD, AAOx3 CV: RRR Pulm: CTAB Abd: Soft, nttp, nd, active BS, no rebound/guarding  Lab Results:   Basename 07/15/12 0344 07/14/12 0340  WBC 5.0 7.9  HGB 11.4* 11.6*  HCT 34.0* 34.6*  PLT 234 243   BMET  Basename 07/14/12 0340 07/13/12 1632  NA 139 142  K 3.7 3.6  CL 106 104  CO2 27 --  GLUCOSE 136* 129*  BUN 9 11  CREATININE 1.12 1.20  CALCIUM 8.8 --   PT/INR No results found for this basename: LABPROT:2,INR:2 in the last 72 hours ABG No results found for this basename: PHART:2,PCO2:2,PO2:2,HCO3:2 in the last 72 hours  Studies/Results: Ct Abdomen Pelvis Wo Contrast  07/13/2012  *RADIOLOGY REPORT*  Clinical Data: Left flank pain.  Worsening pain today.  CT ABDOMEN AND PELVIS WITHOUT CONTRAST  Technique:  Multidetector CT imaging of the abdomen and pelvis was performed following the standard protocol without intravenous contrast.  Comparison: 10/30/2011.  Findings: Lung bases demonstrate dependent atelectasis.  Probable bilateral trace pleural effusions.  Postoperative changes are noted at the gastroesophageal junction, likely for reflux surgery.  Small amount of anterior pericardial fluid or thickening. Unenhanced CT was performed per clinician order.  Lack of IV contrast limits sensitivity and specificity, especially for evaluation of abdominal/pelvic solid viscera.   Liver grossly normal.  High density material is present in the dependent gallbladder, likely representing biliary sludge.  Adrenal glands appear normal.  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  Bilateral nonobstructing renal calculi are present.  The ureters appear normal.  Aortoiliac atherosclerosis.  Urinary bladder normal.  Prostatomegaly.  There are marked inflammatory changes in the left mid abdomen secondary to diverticulitis.  There is a posterior contained perforation (image number 32 series 2).  Phlegmon surrounds this area in the retroperitoneal fat. Area of probable extraluminal gas and feculent material measures 23 mm x 12 mm on image number 32. This is larger than typical colonic diverticula.  Tiny area of apparently extraluminal retroperitoneal gas is present on image number 41 as well.  There is no intra-abdominal free air.  The distal colon demonstrates diverticulosis without diverticulitis.  Small bowel grossly appears within normal limits.  No aggressive osseous lesions.  IMPRESSION: 1.  Descending colonic diverticulitis.  Contained perforation into the posterior retroperitoneal fat with surrounding phlegmon. 2.  Bilateral nonobstructing renal collecting system calculi. 3.  Postsurgical changes at the gastroesophageal junction. 4.  Atherosclerosis.  These results were called by telephone on 07/13/2012 at 1720 hours to physician's assistant [facility], who verbally acknowledged these results.   Original Report Authenticated By: Andreas Newport, M.D.     Anti-infectives: Anti-infectives     Start     Dose/Rate Route Frequency Ordered Stop   07/13/12 2200   metroNIDAZOLE (FLAGYL) IVPB  500 mg        500 mg 100 mL/hr over 60 Minutes Intravenous 3 times per day 07/13/12 2037     07/13/12 2100   ciprofloxacin (CIPRO) IVPB 400 mg        400 mg 200 mL/hr over 60 Minutes Intravenous Every 12  hours 07/13/12 2037     07/13/12 1730   ertapenem (INVANZ) 1 g in sodium chloride 0.9 % 50 mL IVPB        1 g 100 mL/hr over 30 Minutes Intravenous  Once 07/13/12 1725 07/13/12 1914          Assessment/Plan: Diverticulits with micro-perforation  -Advance diet to Clears -transition to PO abx if tolerated liq PO -Ambulate -continue current care   LOS: 2 days    Marigene Ehlers., Jed Limerick 07/15/2012

## 2012-07-16 MED ORDER — ACETAMINOPHEN 325 MG PO TABS: 650.0000 mg | ORAL_TABLET | Freq: Four times a day (QID) | ORAL | Status: DC | PRN

## 2012-07-16 MED ORDER — SODIUM CHLORIDE 0.9 % IJ SOLN: 3.0000 mL | INTRAMUSCULAR | Status: DC | PRN

## 2012-07-16 MED ORDER — SODIUM CHLORIDE 0.9 % IJ SOLN
3.0000 mL | Freq: Two times a day (BID) | INTRAMUSCULAR | Status: DC
  Administered 2012-07-16: 3 mL via INTRAVENOUS

## 2012-07-16 NOTE — Progress Notes (Signed)
Daniel Huynh [id number] 1941-10-03   Subjective:  Pt feels better Tol liquids Minimal pain Family at bedside Walking  Objective:  Vital signs:  Filed Vitals:   07/15/12 0600 07/15/12 1400 07/15/12 2100 07/16/12 0536  BP: 114/67 108/68 115/73 101/66  Pulse: 47 53 50 51  Temp: 97.4 F (36.3 C) 97.5 F (36.4 C) 98.2 F (36.8 C) 97.8 F (36.6 C)  TempSrc: Oral Oral Oral Oral  Resp: 18 18 18 18   Height:      Weight:      SpO2: 100% 97% 98% 98%    Last BM Date: 07/13/12  Intake/Output   Yesterday:  08/30 0701 - 08/31 0700 In: 3200 [I.V.:3200] Out: 2075 [Urine:2075] This shift:  Total I/O In: 600 [I.V.:400; IV Piggyback:200] Out: -   Bowel function:  Flatus: y  BM: n  Physical Exam:  General: Pt awake/alert/oriented x4 in no acute distress Eyes: PERRL, normal EOM.  Sclera clear.  No icterus Neuro: CN II-XII intact w/o focal sensory/motor deficits. Lymph: No head/neck/groin lymphadenopathy Psych:  No delerium/psychosis/paranoia HENT: Normocephalic, Mucus membranes moist.  No thrush Neck: Supple, No tracheal deviation Chest: No chest wall pain w good excursion CV:  Pulses intact.  Regular rhythm Abdomen: Soft.  Nondistended.  Mildly tender at L flank.  No incarcerated hernias. Ext:  SCDs BLE.  No mjr edema.  No cyanosis Skin: No petechiae / purpurae  Problem List:  Principal Problem:  *Diverticulitis with perforation   Assessment  Daniel Huynh  71 y.o. male       Diverticulitis resolving  Plan:  -adv diet -d/c ivf -prob d/c tomorrow on PO Abx in continues to improve -VTE prophylaxis- SCDs, etc -mobilize as tolerated to help recovery  Ardeth Sportsman, M.D., F.A.C.S. Gastrointestinal and Minimally Invasive Surgery [facility] [address], [address] [place], [place] [address] [phone] Main / Paging [phone] Voice Mail   07/16/2012  CARE TEAM:  PCP: No primary provider on file.  Outpatient  Care Team: Patient has no care team.  Inpatient Treatment Team: Treatment Team: Attending Provider: Md Montez Morita, MD; Rounding Team: Md Montez Morita, MD; Registered Nurse: Tristan Schroeder, RN; Registered Nurse: Skipper Cliche, RN; Technician: Lynden Ang, NT; Registered Nurse: Horton Marshall; Technician: Melvenia Beam, NT   Results:   Labs: Results for orders placed during the hospital encounter of 07/13/12 (from the past 48 hour(s))  CBC     Status: Abnormal   Collection Time   07/15/12  3:44 AM      Component Value Range Comment   WBC 5.0  4.0 - 10.5 K/uL    RBC 4.09 (*) 4.22 - 5.81 MIL/uL    Hemoglobin 11.4 (*) 13.0 - 17.0 g/dL    HCT 41.3 (*) 24.4 - 52.0 %    MCV 83.1  78.0 - 100.0 fL    MCH 27.9  26.0 - 34.0 pg    MCHC 33.5  30.0 - 36.0 g/dL    RDW 01.0  27.2 - 53.6 %    Platelets 234  150 - 400 K/uL     Imaging / Studies: No results found.  Medications / Allergies: per chart  Antibiotics: Anti-infectives     Start     Dose/Rate Route Frequency Ordered Stop   07/13/12 2200   metroNIDAZOLE (FLAGYL) IVPB 500 mg        500 mg 100 mL/hr over 60 Minutes Intravenous 3 times per day 07/13/12 2037  07/13/12 2100   ciprofloxacin (CIPRO) IVPB 400 mg        400 mg 200 mL/hr over 60 Minutes Intravenous Every 12 hours 07/13/12 2037     07/13/12 1730   ertapenem (INVANZ) 1 g in sodium chloride 0.9 % 50 mL IVPB        1 g 100 mL/hr over 30 Minutes Intravenous  Once 07/13/12 1725 07/13/12 1914

## 2012-07-17 MED ORDER — HYDROCODONE-ACETAMINOPHEN 5-325 MG PO TABS: 1.0000 | ORAL_TABLET | Freq: Four times a day (QID) | ORAL | Status: AC | PRN

## 2012-07-17 MED ORDER — AMOXICILLIN-POT CLAVULANATE 875-125 MG PO TABS: 1.0000 | ORAL_TABLET | Freq: Two times a day (BID) | ORAL | Status: AC

## 2012-07-17 NOTE — Discharge Summary (Signed)
Physician Discharge Summary  Patient ID: Daniel Huynh MRN: [id number] DOB/AGE: 70-Jan-1942 70 y.o.  Admit date: 07/13/2012 Discharge date: 07/17/2012  Admission Diagnoses:  Discharge Diagnoses:  Principal Problem:  *Diverticulitis with perforation   Discharged Condition: good  Hospital Course: Pt had pain & was admitted with perforated diverticulitis.  Had a colonoscopy in the past (records cannot be found).  Does not recall prior attacks.  Placed on IV Cipro & Flagyl.  Pain decreased & bowel function resumed.  He was gradually advanced on his diet.   The patient mobilized and advanced to a solid diet gradually.  Pain was well-controlled and transitioned off IV medications.    By the time of discharge, the patient was walking well the hallways, eating food well, having flatus.  Pain was-controlled on an oral regimen.  Based on meeting DC criteria and recovering well, I felt it was safe for the patient to be discharged home with close followup.  Instructions were discussed in detail with the family & his daughter.  They are written as well.  He thinks he had a colonoscopy in 2011 but I do not see records of this - will try to sort this out after the holidays  Consults: None  Significant Diagnostic Studies: CT scan showing diverticulitis w microperforation  Treatments: IV Abx (Cipro/Flagyl)  Discharge Exam: Blood pressure 126/76, pulse 61, temperature 98.3 F (36.8 C), temperature source Oral, resp. rate 18, height 5\' 10"  (1.778 m), weight 158 lb 11.7 oz (72 kg), SpO2 100.00%.  General: Pt awake/alert/oriented x4 in no major acute distress Eyes: PERRL, normal EOM. Sclera nonicteric Neuro: CN II-XII intact w/o focal sensory/motor deficits. Lymph: No head/neck/groin lymphadenopathy Psych:  No delerium/psychosis/paranoia HENT: Normocephalic, Mucus membranes moist.  No thrush Neck: Supple, No tracheal deviation Chest: No pain.  Good respiratory excursion. CV:  Pulses intact.  Regular  rhythm Abdomen: Soft, Nondistended.  Nontender.  No incarcerated hernias. Ext:  SCDs BLE.  No significant edema.  No cyanosis Skin: No petechiae / purpurae   Disposition: 01-Home or Self Care  Discharge Orders    Future Orders Please Complete By Expires   Increase activity slowly        Medication List  As of 07/17/2012  9:00 AM   TAKE these medications         amoxicillin-clavulanate 875-125 MG per tablet   Commonly known as: AUGMENTIN   Take 1 tablet by mouth 2 (two) times daily.      buPROPion 150 MG 24 hr tablet   Commonly known as: WELLBUTRIN XL   Take 150 mg by mouth daily.      doxazosin 8 MG tablet   Commonly known as: CARDURA   Take 4 mg by mouth at bedtime.      doxepin 50 MG capsule   Commonly known as: SINEQUAN   Take 50 mg by mouth at bedtime.      gabapentin 100 MG capsule   Commonly known as: NEURONTIN   Take 100 mg by mouth See admin instructions. 5 times daily for nerve pain      HYDROcodone-acetaminophen 5-325 MG per tablet   Commonly known as: NORCO/VICODIN   Take 1-2 tablets by mouth every 6 (six) hours as needed for pain.      pantoprazole 40 MG tablet   Commonly known as: PROTONIX   Take 40 mg by mouth daily as needed. heartburn      sildenafil 100 MG tablet   Commonly known as: VIAGRA   Take 50  mg by mouth daily as needed.      Testosterone 20.25 MG/ACT (1.62%) Gel   Place 2 application onto the skin daily. Pt uses 2 pumps      zolpidem 10 MG tablet   Commonly known as: AMBIEN   Take 5 mg by mouth at bedtime as needed. sleep           Follow-up Information    Follow up with Garlon Tuggle C., MD. Schedule an appointment as soon as possible for a visit in 2 weeks.   Contact information:   [address] [place] [address] [phone]          Signed: Ardeth Sportsman. 07/17/2012, 9:00 AM

## 2012-07-17 NOTE — Progress Notes (Signed)
Patient discharged ambulatory. Given prescription for norco and augmentin. Spoke to pharmacist states should be ok with norco rx with allergies. Assessment unchanged. States understanding of discharge instructions.

## 2012-08-10 ENCOUNTER — Ambulatory Visit (INDEPENDENT_AMBULATORY_CARE_PROVIDER_SITE_OTHER): Payer: Medicare Other | Admitting: Surgery

## 2012-08-10 ENCOUNTER — Telehealth (INDEPENDENT_AMBULATORY_CARE_PROVIDER_SITE_OTHER): Payer: Self-pay

## 2012-08-10 ENCOUNTER — Encounter (INDEPENDENT_AMBULATORY_CARE_PROVIDER_SITE_OTHER): Payer: Self-pay | Admitting: Surgery

## 2012-08-10 VITALS — BP 124/68 | HR 66 | Temp 97.8°F | Resp 14 | Ht 71.0 in | Wt 147.5 lb

## 2012-08-10 DIAGNOSIS — Z860100 Personal history of colon polyps, unspecified: Secondary | ICD-10-CM

## 2012-08-10 DIAGNOSIS — K5732 Diverticulitis of large intestine without perforation or abscess without bleeding: Secondary | ICD-10-CM

## 2012-08-10 DIAGNOSIS — K5780 Diverticulitis of intestine, part unspecified, with perforation and abscess without bleeding: Secondary | ICD-10-CM

## 2012-08-10 DIAGNOSIS — K589 Irritable bowel syndrome without diarrhea: Secondary | ICD-10-CM

## 2012-08-10 NOTE — Telephone Encounter (Signed)
LMOM in medical records with [facility] asking for them to look for pt's last endo and colonoscopy reports so they can be scanned into epic for Dr Michaell Cowing to review. I left my name and # if they need anymore info on the pt.

## 2012-08-10 NOTE — Patient Instructions (Signed)
Diverticulosis Diverticulosis is a common condition that develops when small pouches (diverticula) form in the wall of the colon. The risk of diverticulosis increases with age. It happens more often in people who eat a low-fiber diet. Most individuals with diverticulosis have no symptoms. Those individuals with symptoms usually experience abdominal pain, constipation, or loose stools (diarrhea). HOME CARE INSTRUCTIONS   Increase the amount of fiber in your diet as directed by your caregiver or dietician. This may reduce symptoms of diverticulosis.   Your caregiver may recommend taking a dietary fiber supplement.   Drink at least 6 to 8 glasses of water each day to prevent constipation.   Try not to strain when you have a bowel movement.   Your caregiver may recommend avoiding nuts and seeds to prevent complications, although this is still an uncertain benefit.   Only take over-the-counter or prescription medicines for pain, discomfort, or fever as directed by your caregiver.  FOODS WITH HIGH FIBER CONTENT INCLUDE:  Fruits. Apple, peach, pear, tangerine, raisins, prunes.   Vegetables. Brussels sprouts, asparagus, broccoli, cabbage, carrot, cauliflower, romaine lettuce, spinach, summer squash, tomato, winter squash, zucchini.   Starchy Vegetables. Baked beans, kidney beans, lima beans, split peas, lentils, potatoes (with skin).   Grains. Whole wheat bread, brown rice, bran flake cereal, plain oatmeal, white rice, shredded wheat, bran muffins.  SEEK IMMEDIATE MEDICAL CARE IF:   You develop increasing pain or severe bloating.   You have an oral temperature above 102 F (38.9 C), not controlled by medicine.   You develop vomiting or bowel movements that are bloody or black.  Document Released: 07/30/2004 Document Revised: 10/22/2011 Document Reviewed: 04/02/2010 [facility] Patient Information 2012 [place], [place].  Diverticulitis Small pockets or "bubbles" can develop in the wall of the  intestine. Diverticulitis is when those pockets become infected and inflamed. This causes stomach pain (usually on the left side). HOME CARE  Take all medicine as told by your doctor.   Try a clear liquid diet (broth, tea, or water) for as long as told by your doctor.   Keep all follow-up visits with your doctor.   You may be put on a low-fiber diet once you start feeling better. Here are foods that have low-fiber:   White breads, cereals, rice, and pasta.   Cooked fruits and vegetables or soft fresh fruits and vegetables without the skin.   Ground or well-cooked tender beef, ham, veal, lamb, pork, or poultry.   Eggs and seafood.   After you are doing well on the low-fiber diet, you may be put on a high-fiber diet. Here are ways to increase your fiber:   Choose whole-grain breads, cereals, pasta, and brown rice.   Choose fruits and vegetables with skin on. Do not overcook the vegetables.   Choose nuts, seeds, legumes, dried peas, beans, and lentils.   Look for food products that have more than 3 grams of fiber per serving on the food label.  GET HELP RIGHT AWAY IF:  Your pain does not get better or gets worse.   You have trouble eating food.   You are not pooping (having bowel movements) like normal.   You have a temperature by mouth above 102 F (38.9 C), not controlled by medicine.   You keep throwing up (vomiting).   You have bloody or black, tarry poop (stools).   You are getting worse and not better.  MAKE SURE YOU:   Understand these instructions.   Will watch your condition.   Will get  help right away if you are not doing well or get worse.  Document Released: 04/20/2008 Document Revised: 10/22/2011 Document Reviewed: 09/23/2009 [facility] Patient Information 2012 [place], [place].

## 2012-08-10 NOTE — Progress Notes (Signed)
Subjective:     Patient ID: Daniel Huynh, male   DOB: 11-27-1940, 71 y.o.   MRN: [id number]  HPI  Daniel Huynh  1941-02-17 [id number]  Patient Care Team: Floyde Parkins as PCP - General (Internal Medicine) Louis Meckel, MD as Consulting Physician (Gastroenterology)  This patient is a 71 y.o.male who presents today for surgical evaluation.   The patient follows up after an admission for diverticulitis with perforation.  He improved with a few days of IV antibiotics.  He transitioned to oral antibiotics.  He's completed all of them over a week ago.  Denies any fevers or chills.  Eating well.  Had one episode of a twinge of pain in the left lower corner but otherwise has been pain free.  Still has some mild loose stools.  He think that is related to his irritable bowel syndrome.  Recalls getting his last colonoscopy in 2012 by Dr. Arlyce Dice.  He thinks that there was a small polyp that was left in (?  Hyperplastic polyp).  Was told five year followup.  No fevers or chills.  He [facility] to [facility] [place] [facility].  No history of prior attacks that he can recall.  No history of any colon problems.  No rectal bleeding.  Patient Active Problem List  Diagnosis  . Anxiety  . Depression  . Diverticulitis with perforation  . Personal history of colonic polyps  . IBS (irritable bowel syndrome)    Past Medical History  Diagnosis Date  . Depression   . Anxiety   . Unintentional weight loss   . Generalized headaches   . Diarrhea   . Difficulty urinating   . Arthritis   . Diverticulitis of colon     Past Surgical History  Procedure Date  . Laparoscopic gastrotomy w/ repair of ulcer 1979    History   Social History  . Marital Status: Married    Spouse Name: N/A    Number of Children: N/A  . Years of Education: N/A   Occupational History  . Not on file.   Social History Main Topics  . Smoking status: Never Smoker   . Smokeless tobacco: Not on file  .  Alcohol Use: No  . Drug Use: No  . Sexually Active: Not on file   Other Topics Concern  . Not on file   Social History Narrative  . No narrative on file    History reviewed. No pertinent family history.  Current Outpatient Prescriptions  Medication Sig Dispense Refill  . buPROPion (WELLBUTRIN XL) 150 MG 24 hr tablet Take 150 mg by mouth daily.      Marland Kitchen doxazosin (CARDURA) 8 MG tablet Take 4 mg by mouth at bedtime.        Marland Kitchen doxepin (SINEQUAN) 50 MG capsule Take 50 mg by mouth at bedtime.        . gabapentin (NEURONTIN) 100 MG capsule Take 100 mg by mouth See admin instructions. 5 times daily for nerve pain      . pantoprazole (PROTONIX) 40 MG tablet Take 40 mg by mouth daily as needed. heartburn      . sildenafil (VIAGRA) 100 MG tablet Take 50 mg by mouth daily as needed.      . Testosterone 20.25 MG/ACT (1.62%) GEL Place 2 application onto the skin daily. Pt uses 2 pumps      . zolpidem (AMBIEN) 10 MG tablet Take 5 mg by mouth at bedtime as needed. sleep  Allergies  Allergen Reactions  . Tylenol With Codeine #3 (Acetaminophen-Codeine) Rash    BP 124/68  Pulse 66  Temp 97.8 F (36.6 C) (Temporal)  Resp 14  Ht 5\' 11"  (1.803 m)  Wt 147 lb 8 oz (66.906 kg)  BMI 20.57 kg/m2  Ct Abdomen Pelvis Wo Contrast  07/13/2012  *RADIOLOGY REPORT*  Clinical Data: Left flank pain.  Worsening pain today.  CT ABDOMEN AND PELVIS WITHOUT CONTRAST  Technique:  Multidetector CT imaging of the abdomen and pelvis was performed following the standard protocol without intravenous contrast.  Comparison: 10/30/2011.  Findings: Lung bases demonstrate dependent atelectasis.  Probable bilateral trace pleural effusions.  Postoperative changes are noted at the gastroesophageal junction, likely for reflux surgery.  Small amount of anterior pericardial fluid or thickening. Unenhanced CT was performed per clinician order.  Lack of IV contrast limits sensitivity and specificity, especially for evaluation of  abdominal/pelvic solid viscera.  Liver grossly normal.  High density material is present in the dependent gallbladder, likely representing biliary sludge.  Adrenal glands appear normal.  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  Bilateral nonobstructing renal calculi are present.  The ureters appear normal.  Aortoiliac atherosclerosis.  Urinary bladder normal.  Prostatomegaly.  There are marked inflammatory changes in the left mid abdomen secondary to diverticulitis.  There is a posterior contained perforation (image number 32 series 2).  Phlegmon surrounds this area in the retroperitoneal fat. Area of probable extraluminal gas and feculent material measures 23 mm x 12 mm on image number 32. This is larger than typical colonic diverticula.  Tiny area of apparently extraluminal retroperitoneal gas is present on image number 41 as well.  There is no intra-abdominal free air.  The distal colon demonstrates diverticulosis without diverticulitis.  Small bowel grossly appears within normal limits.  No aggressive osseous lesions.  IMPRESSION: 1.  Descending colonic diverticulitis.  Contained perforation into the posterior retroperitoneal fat with surrounding phlegmon. 2.  Bilateral nonobstructing renal collecting system calculi. 3.  Postsurgical changes at the gastroesophageal junction. 4.  Atherosclerosis.  These results were called by telephone on 07/13/2012 at 1720 hours to physician's assistant [facility], who verbally acknowledged these results.   Original Report Authenticated By: Andreas Newport, M.D.      Review of Systems  Constitutional: Negative for fever, chills and diaphoresis.  HENT: Negative for sore throat, trouble swallowing and neck pain.   Eyes: Negative for photophobia and visual disturbance.  Respiratory: Negative for choking and shortness of breath.   Cardiovascular: Negative for chest pain and  palpitations.  Gastrointestinal: Negative for nausea, vomiting, abdominal pain, diarrhea, constipation, blood in stool, abdominal distention, anal bleeding and rectal pain.  Genitourinary: Negative for dysuria, urgency, difficulty urinating and testicular pain.  Musculoskeletal: Negative for myalgias, arthralgias and gait problem.  Skin: Negative for color change and rash.  Neurological: Negative for dizziness, speech difficulty, weakness and numbness.  Hematological: Negative for adenopathy.  Psychiatric/Behavioral: Negative for hallucinations, confusion and agitation.       Objective:   Physical Exam  Constitutional: He is oriented to person, place, and time. He appears well-developed and well-nourished. No distress.  HENT:  Head: Normocephalic.  Mouth/Throat: Oropharynx is clear and moist. No oropharyngeal exudate.  Eyes: Conjunctivae normal and EOM are normal. Pupils are equal, round, and reactive to light. No scleral icterus.  Neck: Normal range of motion. No tracheal deviation present.  Cardiovascular: Normal rate, normal heart  sounds and intact distal pulses.   Pulmonary/Chest: Effort normal. No respiratory distress.  Abdominal: Soft. He exhibits no distension. There is no tenderness. Hernia confirmed negative in the right inguinal area and confirmed negative in the left inguinal area.       Incisions clean with normal healing ridges.  No hernias  Musculoskeletal: Normal range of motion. He exhibits no tenderness.  Neurological: He is alert and oriented to person, place, and time. No cranial nerve deficit. He exhibits normal muscle tone. Coordination normal.  Skin: Skin is warm and dry. No rash noted. He is not diaphoretic.  Psychiatric: He has a normal mood and affect. His behavior is normal.       Assessment:     Diverticulitis with microperforation.  Improved on IV antibiotics.  Now off all antibiotics.  Only episode the patient can recall.    Plan:     At this point,  because he's only had one attack and it was not severe in that required drainage and abscesses or evidence of shock, I think it is reasonable to observe only.  I had a long discussion with the patient about options.   If he wishes to avoid another attack, surgery would be a more aggressive option.  I will see if there is any concerning evidence of prior attacks or other issues with gastroenterology records.  Because he had an endoscopy last year, do not feel too strongly he needs to undergo repeat endoscopy.  However, I reserve the right change my mind until I can actually see the reports  High-fiber diet.  Try and get his endoscopy records from Dr. Arlyce Dice.  We will try and send copies of all her findings to his primary care physician at the [facility], Dr. Jules Husbands.

## 2013-06-05 ENCOUNTER — Emergency Department (INDEPENDENT_AMBULATORY_CARE_PROVIDER_SITE_OTHER)
Admission: EM | Admit: 2013-06-05 | Discharge: 2013-06-05 | Disposition: A | Discharge status: Home / Self Care | Payer: Medicare Other | Source: Home / Self Care | Attending: Family Medicine | Admitting: Family Medicine

## 2013-06-05 ENCOUNTER — Emergency Department (INDEPENDENT_AMBULATORY_CARE_PROVIDER_SITE_OTHER): Payer: Medicare Other

## 2013-06-05 ENCOUNTER — Encounter (HOSPITAL_COMMUNITY): Payer: Self-pay

## 2013-06-05 DIAGNOSIS — J439 Emphysema, unspecified: Secondary | ICD-10-CM

## 2013-06-05 DIAGNOSIS — J438 Other emphysema: Secondary | ICD-10-CM

## 2013-06-05 NOTE — ED Notes (Addendum)
Reported 1 year duration of SOB w exertion, CP  w radiation into left arm w exertion, fatigue w exertion. NAD at present, w/d/color good. Had a skin test at [facility], which was red, raised, left a scar. Was reportedly advised to not have another skin test, but to have a chest xray for concerns. Was sent here by his MD for a chest xray

## 2013-06-05 NOTE — ED Provider Notes (Signed)
History    CSN: [id number] Arrival date & time 06/05/13  1245  First MD Initiated Contact with Patient 06/05/13 1308     Chief Complaint  Patient presents with  . Chest Pain   (Consider location/radiation/quality/duration/timing/severity/associated sxs/prior Treatment) Patient is a 72 y.o. male presenting with chest pain. The history is provided by the patient and the spouse.  Chest Pain Onset quality:  Gradual Duration:  12 months Chronicity:  Chronic Context comment:  [facility], seen by lmd-psychologist today who referred him here for cxr. Associated symptoms: shortness of breath    Past Medical History  Diagnosis Date  . Depression   . Anxiety   . Unintentional weight loss   . Generalized headaches   . Diarrhea   . Difficulty urinating   . Arthritis   . Diverticulitis of colon    Past Surgical History  Procedure Laterality Date  . Laparoscopic gastrotomy w/ repair of ulcer  1979   History reviewed. No pertinent family history. History  Substance Use Topics  . Smoking status: Never Smoker   . Smokeless tobacco: Not on file  . Alcohol Use: 0.6 oz/week    1 Glasses of wine per week    Review of Systems  Constitutional: Negative.   Respiratory: Positive for shortness of breath.   Cardiovascular: Positive for chest pain.    Allergies  Tylenol with codeine #3  Home Medications   Current Outpatient Rx  Name  Route  Sig  Dispense  Refill  . buPROPion (WELLBUTRIN XL) 150 MG 24 hr tablet   Oral   Take 150 mg by mouth daily.         Marland Kitchen doxazosin (CARDURA) 8 MG tablet   Oral   Take 4 mg by mouth at bedtime.           Marland Kitchen doxepin (SINEQUAN) 50 MG capsule   Oral   Take 50 mg by mouth at bedtime.           . gabapentin (NEURONTIN) 100 MG capsule   Oral   Take 100 mg by mouth See admin instructions. 5 times daily for nerve pain         . pantoprazole (PROTONIX) 40 MG tablet   Oral   Take 40 mg by mouth daily as needed.  heartburn         . sildenafil (VIAGRA) 100 MG tablet   Oral   Take 50 mg by mouth daily as needed.         . Testosterone 20.25 MG/ACT (1.62%) GEL   Transdermal   Place 2 application onto the skin daily. Pt uses 2 pumps         . zolpidem (AMBIEN) 10 MG tablet   Oral   Take 5 mg by mouth at bedtime as needed. sleep          BP 140/80  Pulse 55  Temp(Src) 97.4 F (36.3 C) (Oral)  Resp 15  SpO2 98% Physical Exam  Nursing note and vitals reviewed. Constitutional: He is oriented to person, place, and time. He appears well-developed and well-nourished.  HENT:  Head: Normocephalic.  Mouth/Throat: Oropharynx is clear and moist.  Eyes: Conjunctivae are normal. Pupils are equal, round, and reactive to light.  Neck: Normal range of motion. Neck supple.  Cardiovascular: Normal rate, regular rhythm, normal heart sounds and intact distal pulses.   Pulmonary/Chest: Effort normal and breath sounds normal.  Musculoskeletal: He exhibits no edema.  Neurological:  He is alert and oriented to person, place, and time.  Skin: Skin is warm and dry.    ED Course  Procedures (including critical care time) Labs Reviewed - No data to display Dg Chest 2 View  06/05/2013   *RADIOLOGY REPORT*  Clinical Data: Shortness of breath and chest pain for 1 year, positive PPD  CHEST - 2 VIEW  Comparison: None  Findings: Normal heart size, mediastinal contours, and pulmonary vascularity. Atherosclerotic calcification aortic arch. Emphysematous changes question COPD. Surgical clips at proximal stomach and gastroesophageal junction. No acute infiltrate, pleural effusion or pneumothorax. Bones unremarkable.  IMPRESSION: Emphysematous changes question COPD. No acute infiltrate.   Original Report Authenticated By: Ulyses Southward, M.D.   1. Emphysema/COPD     MDM  X-rays reviewed and report per radiologist.   Linna Hoff, MD 06/05/13 [phone]

## 2013-06-15 ENCOUNTER — Ambulatory Visit (INDEPENDENT_AMBULATORY_CARE_PROVIDER_SITE_OTHER): Payer: Medicare Other | Admitting: Internal Medicine

## 2013-06-15 ENCOUNTER — Other Ambulatory Visit (INDEPENDENT_AMBULATORY_CARE_PROVIDER_SITE_OTHER): Payer: Medicare Other

## 2013-06-15 ENCOUNTER — Encounter: Payer: Self-pay | Admitting: Internal Medicine

## 2013-06-15 VITALS — BP 104/78 | HR 58 | Temp 97.7°F | Ht 71.0 in | Wt 156.8 lb

## 2013-06-15 DIAGNOSIS — R5383 Other fatigue: Secondary | ICD-10-CM

## 2013-06-15 DIAGNOSIS — R05 Cough: Secondary | ICD-10-CM

## 2013-06-15 DIAGNOSIS — R5381 Other malaise: Secondary | ICD-10-CM

## 2013-06-15 LAB — CBC WITH DIFFERENTIAL/PLATELET
Basophils Absolute: 0 10*3/uL (ref 0.0–0.1)
Eosinophils Absolute: 0.1 10*3/uL (ref 0.0–0.7)
Eosinophils Relative: 2.4 % (ref 0.0–5.0)
HCT: 42.8 % (ref 39.0–52.0)
Lymphocytes Relative: 25.1 % (ref 12.0–46.0)
Lymphs Abs: 1.1 10*3/uL (ref 0.7–4.0)
MCHC: 32.6 g/dL (ref 30.0–36.0)
Monocytes Absolute: 0.4 10*3/uL (ref 0.1–1.0)
Neutrophils Relative %: 62.7 % (ref 43.0–77.0)
Platelets: 228 10*3/uL (ref 150.0–400.0)
RBC: 4.88 Mil/uL (ref 4.22–5.81)
RDW: 14.1 % (ref 11.5–14.6)
WBC: 4.5 10*3/uL (ref 4.5–10.5)

## 2013-06-15 LAB — HEPATIC FUNCTION PANEL
Albumin: 3.9 g/dL (ref 3.5–5.2)
Bilirubin, Direct: 0.1 mg/dL (ref 0.0–0.3)
Total Bilirubin: 0.7 mg/dL (ref 0.3–1.2)
Total Protein: 6.6 g/dL (ref 6.0–8.3)

## 2013-06-15 LAB — BASIC METABOLIC PANEL
BUN: 14 mg/dL (ref 6–23)
CO2: 28 mEq/L (ref 19–32)
Calcium: 9.6 mg/dL (ref 8.4–10.5)
Creatinine, Ser: 1.2 mg/dL (ref 0.4–1.5)
Glucose, Bld: 99 mg/dL (ref 70–99)

## 2013-06-15 LAB — TSH: TSH: 1.71 u[IU]/mL (ref 0.35–5.50)

## 2013-06-15 NOTE — Progress Notes (Signed)
  Subjective:    Patient ID: Daniel Huynh, male    DOB: 26-Feb-1941   MRN: [id number]  HPI  56 yobm quit smoking in 1970s no health problems then admitted to [facility] in February 2014 for mental issues with pos skin test for TB and treated for a month only ? why referrred by UC for cough ? Copd?.   06/15/2013 1st pulmonary eval Daniel Huynh cc some cough and sob wax and wane x 6 months more tired than sob, still play golf.  Never on resp meds. Dry cough day > night, no limiting sob from desired activities. Has tried protonix but causes bloating.   No obvious daytime variabilty or assoc  cp or chest tightness, subjective wheeze overt sinus or hb symptoms. No unusual exp hx or h/o childhood pna/ asthma or knowledge of premature birth.   Sleeping ok without nocturnal  or early am exacerbation  of respiratory  c/o's or need for noct saba. Also denies any obvious fluctuation of symptoms with weather or environmental changes or other aggravating or alleviating factors except as outlined above   Current Medications, Allergies, Past Medical History, Past Surgical History, Family History, and Social History were reviewed in [employer] record.         Review of Systems  Constitutional: Positive for activity change. Negative for fever, chills, appetite change and unexpected weight change.  HENT: Negative for congestion, sore throat, rhinorrhea, sneezing, trouble swallowing, dental problem, voice change and postnasal drip.   Eyes: Negative for visual disturbance.  Respiratory: Positive for cough and shortness of breath. Negative for choking.   Cardiovascular: Negative for chest pain and leg swelling.  Gastrointestinal: Negative for nausea, vomiting and abdominal pain.  Genitourinary: Negative for difficulty urinating.  Musculoskeletal: Negative for arthralgias.  Skin: Negative for rash.  Psychiatric/Behavioral: Negative for behavioral problems and confusion.       Objective:    Physical Exam  Wt Readings from Last 3 Encounters:  06/15/13 156 lb 12.8 oz (71.124 kg)  08/10/12 147 lb 8 oz (66.906 kg)  07/13/12 158 lb 11.7 oz (72 kg)     HEENT mild turbinate edema.  Oropharynx no thrush or excess pnd or cobblestoning.  No JVD or cervical adenopathy. Mild accessory muscle hypertrophy. Trachea midline, nl thryroid. Chest was hyperinflated by percussion with diminished breath sounds and moderate increased exp time without wheeze. Hoover sign positive at mid inspiration. Regular rate and rhythm without murmur gallop or rub or increase P2 or edema.  Abd: no hsm, nl excursion. Ext warm without cyanosis or clubbing.     cxr 06/05/13 Emphysematous changes question COPD.  No acute infiltrate.     Assessment & Plan:

## 2013-06-15 NOTE — Patient Instructions (Addendum)
Please remember to go to the lab  department downstairs for your tests - we will call you with the results when they are available.  Stop protonix and pecid ac 20 mg take it after bfast and after supper  GERD (REFLUX)  is an extremely common cause of respiratory symptoms, many times with no significant heartburn at all.    It can be treated with medication, but also with lifestyle changes including avoidance of late meals, excessive alcohol, smoking cessation, and avoid fatty foods, chocolate, peppermint, colas, red wine, and acidic juices such as orange juice.  NO MINT OR MENTHOL PRODUCTS SO NO COUGH DROPS  USE SUGARLESS CANDY INSTEAD (jolley ranchers or Stover's)  NO OIL BASED VITAMINS - use powdered substitutes.   No undercooked vegetables dairy products or salads   Please schedule a follow up office visit in 6 weeks, call sooner if needed pfts

## 2013-06-16 NOTE — Progress Notes (Signed)
Quick Note:  Spoke with pt and notified of results per Dr. Wert. Pt verbalized understanding and denied any questions.  ______ 

## 2013-06-18 DIAGNOSIS — R05 Cough: Secondary | ICD-10-CM | POA: Insufficient documentation

## 2013-06-18 NOTE — Assessment & Plan Note (Signed)
The most common causes of chronic cough in immunocompetent adults include the following: upper airway cough syndrome (UACS), previously referred to as postnasal drip syndrome (PNDS), which is caused by variety of rhinosinus conditions; (2) asthma; (3) GERD; (4) chronic bronchitis from cigarette smoking or other inhaled environmental irritants; (5) nonasthmatic eosinophilic bronchitis; and (6) bronchiectasis.   These conditions, singly or in combination, have accounted for up to 94% of the causes of chronic cough in prospective studies.   Other conditions have constituted no >6% of the causes in prospective studies These have included bronchogenic carcinoma, chronic interstitial pneumonia, sarcoidosis, left ventricular failure, ACEI-induced cough, and aspiration from a condition associated with pharyngeal dysfunction.    Chronic cough is often simultaneously caused by more than one condition. A single cause has been found from 38 to 82% of the time, multiple causes from 18 to 62%. Multiply caused cough has been the result of three diseases up to 42% of the time.       Most likely  this is  Classic Upper airway cough syndrome, so named because it's frequently impossible to sort out how much is  CR/sinusitis with freq throat clearing (which can be related to primary GERD)   vs  causing  secondary (" extra esophageal")  GERD from wide swings in gastric pressure that occur with throat clearing, often  promoting self use of mint and menthol lozenges that reduce the lower esophageal sphincter tone and exacerbate the problem further in a cyclical fashion.   These are the same pts (now being labeled as having "irritable larynx syndrome" by some cough centers) who not infrequently have a history of having failed to tolerate ace inhibitors,  dry powder inhalers or biphosphonates or report having atypical reflux symptoms that don't respond to standard doses of PPI , and are easily confused as having aecopd or asthma  flares by even experienced allergists/ pulmonologists.  Since can't take ppi apparently rec trial of h2 bid and diet and f/u in 6 weeks with pft's

## 2013-06-21 ENCOUNTER — Telehealth: Payer: Self-pay | Admitting: Internal Medicine

## 2013-06-21 ENCOUNTER — Other Ambulatory Visit: Payer: Self-pay

## 2013-06-21 NOTE — Telephone Encounter (Signed)
I spoke with pt. He stated MW advised him from last OV he could see a Dr. Manson Passey in [facility]. He thought he knew where this was located but does not. I advised will send message over to MW to see if he recalls. Please advise thanks

## 2013-06-21 NOTE — Telephone Encounter (Signed)
Dr Christell Constant has an office in [place] that is run by Lamount Cranker, a PA - see if we can get him the phone number to that clinic from Dr Christell Constant in [place]

## 2013-06-21 NOTE — Telephone Encounter (Signed)
I called and made pt aware. He stated he knows where Dr. Kathi Der office is He was there this AM but just had the wrong dr's name. Nothing further was needed

## 2013-07-06 ENCOUNTER — Encounter: Payer: Self-pay | Admitting: Gastroenterology

## 2013-07-27 ENCOUNTER — Ambulatory Visit (INDEPENDENT_AMBULATORY_CARE_PROVIDER_SITE_OTHER): Payer: Medicare Other | Admitting: Internal Medicine

## 2013-07-27 ENCOUNTER — Encounter: Payer: Self-pay | Admitting: Internal Medicine

## 2013-07-27 VITALS — BP 96/60 | HR 56 | Temp 97.8°F | Ht 70.5 in | Wt 152.0 lb

## 2013-07-27 DIAGNOSIS — R059 Cough, unspecified: Secondary | ICD-10-CM

## 2013-07-27 DIAGNOSIS — R05 Cough: Secondary | ICD-10-CM

## 2013-07-27 DIAGNOSIS — R0609 Other forms of dyspnea: Secondary | ICD-10-CM

## 2013-07-27 DIAGNOSIS — R5381 Other malaise: Secondary | ICD-10-CM

## 2013-07-27 DIAGNOSIS — R0600 Dyspnea, unspecified: Secondary | ICD-10-CM

## 2013-07-27 DIAGNOSIS — J45909 Unspecified asthma, uncomplicated: Secondary | ICD-10-CM | POA: Insufficient documentation

## 2013-07-27 NOTE — Progress Notes (Addendum)
  Subjective:    Patient ID: Daniel Huynh, male    DOB: [birth year]   MRN: [id number]   Brief patient profile:  71 yobm quit smoking in 1970s no health problems then admitted to [facility] in February 2014 for mental issues with pos skin test for TB and treated for a month only ? why referrred by UC for cough ? Copd? > proved to have completely reversible airflow obst 07/28/13   HPI 06/15/2013 1st pulmonary eval /[facility] cc some cough and sob wax and wane x 6 months more tired than sob, still play golf.  Never on resp meds. Dry cough day > night, no limiting sob from desired activities. Has tried protonix but causes bloating.  rec Please remember to go to the lab  department downstairs for your tests - we will call you with the results when they are available. Stop protonix and pecid ac 20 mg take it after bfast and after supper GERD  No undercooked vegetables dairy products or salads  07/27/2013 f/u ov/[facility]  Chief Complaint  Patient presents with  . Followup with PFT    Breathing is unchanged. Cough has almost resolved. No new co's today.      not really limited by sob, no cough at present     No obvious day to day or daytime variabilty or assoc cp or chest tightness, subjective wheeze overt sinus or hb symptoms. No unusual exp hx or h/o childhood pna/ asthma or knowledge of premature birth.  Sleeping ok without nocturnal  or early am exacerbation  of respiratory  c/o's or need for noct saba. Also denies any obvious fluctuation of symptoms with weather or environmental changes or other aggravating or alleviating factors except as outlined above   Current Medications, Allergies, Complete Past Medical History, Past Surgical History, Family History, and Social History were reviewed in [employer] record.  ROS  The following are not active complaints unless bolded sore throat, dysphagia, dental problems, itching, sneezing,  nasal congestion or excess/ purulent  secretions, ear ache,   fever, chills, sweats, unintended wt loss, pleuritic or exertional cp, hemoptysis,  orthopnea pnd or leg swelling, presyncope, palpitations, heartburn, abdominal pain, anorexia, nausea, vomiting, diarrhea  or change in bowel or urinary habits, change in stools or urine, dysuria,hematuria,  rash, arthralgias, visual complaints, headache, numbness weakness or ataxia or problems with walking or coordination,  change in mood/affect or memory.                     Objective:   Physical Exam  07/27/2013       152  Wt Readings from Last 3 Encounters:  06/15/13 156 lb 12.8 oz (71.124 kg)  08/10/12 147 lb 8 oz (66.906 kg)  07/13/12 158 lb 11.7 oz (72 kg)     HEENT mild turbinate edema.  Oropharynx no thrush or excess pnd or cobblestoning.  No JVD or cervical adenopathy. Mild accessory muscle hypertrophy. Trachea midline, nl thryroid. Chest was hyperinflated by percussion with diminished breath sounds and moderate increased exp time without wheeze. Hoover sign positive at mid inspiration. Regular rate and rhythm without murmur gallop or rub or increase P2 or edema.  Abd: no hsm, nl excursion. Ext warm without cyanosis or clubbing.     cxr 06/05/13 Emphysematous changes question COPD.  No acute infiltrate.     Assessment & Plan:

## 2013-07-27 NOTE — Progress Notes (Signed)
PFT done today. 

## 2013-07-27 NOTE — Patient Instructions (Addendum)
To get the most out of exercise, you need to be continuously aware that you are short of breath, but never out of breath, for 30 minutes daily. As you improve, it will actually be easier for you to do the same amount of exercise  in  30 minutes so always push to the level where you are short of breath.       Pulmonary follow up is as needed

## 2013-07-28 NOTE — Assessment & Plan Note (Addendum)
-   pfts'   07/27/2013  FEV1 2.88 (97%) with ratio 60 with 13% better p B2 with normalized ratio and dlco 74%   PFTs c/w  mild asthma (not copd)  but no need to treat at present as no active symptoms  He does appear to have an element of deconditioning > discussed with f/u prn

## 2013-07-28 NOTE — Assessment & Plan Note (Addendum)
Resolved with rx for GERD that did not include ppi which caused abd bloating/ also resolved

## 2013-08-03 ENCOUNTER — Ambulatory Visit (INDEPENDENT_AMBULATORY_CARE_PROVIDER_SITE_OTHER): Payer: Medicare Other | Admitting: Gastroenterology

## 2013-08-03 ENCOUNTER — Encounter: Payer: Self-pay | Admitting: Gastroenterology

## 2013-08-03 VITALS — BP 100/70 | HR 48 | Ht 70.5 in | Wt 153.4 lb

## 2013-08-03 DIAGNOSIS — Z860100 Personal history of colon polyps, unspecified: Secondary | ICD-10-CM

## 2013-08-03 DIAGNOSIS — R197 Diarrhea, unspecified: Secondary | ICD-10-CM | POA: Insufficient documentation

## 2013-08-03 DIAGNOSIS — F329 Major depressive disorder, single episode, unspecified: Secondary | ICD-10-CM

## 2013-08-03 DIAGNOSIS — F3289 Other specified depressive episodes: Secondary | ICD-10-CM

## 2013-08-03 NOTE — Assessment & Plan Note (Signed)
Plan followup colonoscopy 

## 2013-08-03 NOTE — Assessment & Plan Note (Signed)
Diarrhea is a long-standing problem and may be do to IBS.  He is on medications that could cause diarrhea.  Microscopic colitis should be ruled out.  Recommendations #1 begin Imodium 2 tabs every morning and every 6 hours when necessary #2 colonoscopy

## 2013-08-03 NOTE — Patient Instructions (Addendum)
You have been scheduled for a colonoscopy with propofol. Please follow written instructions given to you at your visit today.  Please pick up your prep kit at the pharmacy within the next 1-3 days. If you use inhalers (even only as needed), please bring them with you on the day of your procedure. Your physician has requested that you go to www.startemmi.com and enter the access code given to you at your visit today. This web site gives a general overview about your procedure. However, you should still follow specific instructions given to you by our office regarding your preparation for the procedure.  Start Imodium twice a day every morning then one every 6 hours as needed

## 2013-08-03 NOTE — Assessment & Plan Note (Deleted)
Diarrhea is a long-standing problem and may be do to IBS.  He is on medications that could cause diarrhea.  Microscopic colitis should be ruled out.  Recommendations #1 colonoscopy

## 2013-08-03 NOTE — Progress Notes (Signed)
History of Present Illness: Pleasant 72 year old Afro-American male with history of colon polyps, diverticulitis referred for evaluation of diarrhea.  For years she's been experiencing postprandial diarrhea.  Stools are watery and poorly formed and occurr to 3-4 times a day.  He denies rectal bleeding or melena.  There been no recent changes in medications.  He feels that his symptoms are worsening.  He's lost 10 pounds over the past few months.  The patient had acute diverticulitis with microperforation approximately year ago that was treated medically.  An adenomatous colon polyp was removed in 2005.    Past Medical History  Diagnosis Date  . Depression   . Anxiety   . Unintentional weight loss   . Generalized headaches   . Diarrhea   . Difficulty urinating   . Arthritis   . Diverticulitis of colon    Past Surgical History  Procedure Laterality Date  . Laparoscopic gastrotomy w/ repair of ulcer  1979   family history includes Cancer in his sister; Heart disease in his mother. Current Outpatient Prescriptions  Medication Sig Dispense Refill  . buPROPion (WELLBUTRIN XL) 150 MG 24 hr tablet Take 150 mg by mouth daily.      Marland Kitchen doxazosin (CARDURA) 8 MG tablet Take 4 mg by mouth at bedtime.        Marland Kitchen doxepin (SINEQUAN) 50 MG capsule Take 50 mg by mouth at bedtime.        . famotidine (PEPCID) 20 MG tablet Take 20 mg by mouth 2 (two) times daily.      Marland Kitchen gabapentin (NEURONTIN) 100 MG capsule Take 100 mg by mouth See admin instructions. 5 times daily for nerve pain      . sildenafil (VIAGRA) 100 MG tablet Take 50 mg by mouth daily as needed.      . Testosterone 20.25 MG/ACT (1.62%) GEL Place 2 application onto the skin daily. Pt uses 2 pumps      . zolpidem (AMBIEN) 10 MG tablet Take 5 mg by mouth at bedtime as needed. sleep       No current facility-administered medications for this visit.   Allergies as of 08/03/2013 - Review Complete 08/03/2013  Allergen Reaction Noted  . Tylenol with  codeine #3 [acetaminophen-codeine] Rash 07/13/2012    reports that he quit smoking about 42 years ago. His smoking use included Cigarettes. He has a 2.5 pack-year smoking history. He has never used smokeless tobacco. He reports that he drinks about 0.6 ounces of alcohol per week. He reports that he does not use illicit drugs.     Review of Systems: He complains of insomnia.  Pertinent positive and negative review of systems were noted in the above HPI section. All other review of systems were otherwise negative.  Vital signs were reviewed in today's medical record Physical Exam: General: Well developed , well nourished, no acute distress Skin: anicteric Head: Normocephalic and atraumatic Eyes:  sclerae anicteric, EOMI Ears: Normal auditory acuity Mouth: No deformity or lesions Neck: Supple, no masses or thyromegaly Lungs: Clear throughout to auscultation Heart: Regular rate and rhythm; no murmurs, rubs or bruits Abdomen: Soft, non tender and non distended. No masses, hepatosplenomegaly or hernias noted. Normal Bowel sounds Rectal:deferred Musculoskeletal: Symmetrical with no gross deformities  Skin: No lesions on visible extremities Pulses:  Normal pulses noted Extremities: No clubbing, cyanosis, edema or deformities noted Neurological: Alert oriented x 4, grossly nonfocal Cervical Nodes:  No significant cervical adenopathy Inguinal Nodes: No significant inguinal adenopathy Psychological:  Alert and cooperative.  Normal mood and affect

## 2013-08-04 ENCOUNTER — Ambulatory Visit (AMBULATORY_SURGERY_CENTER): Payer: Medicare Other | Admitting: Gastroenterology

## 2013-08-04 ENCOUNTER — Encounter: Payer: Self-pay | Admitting: Gastroenterology

## 2013-08-04 VITALS — BP 120/65 | HR 60 | Temp 97.0°F | Resp 15 | Ht 70.5 in | Wt 153.0 lb

## 2013-08-04 DIAGNOSIS — K648 Other hemorrhoids: Secondary | ICD-10-CM

## 2013-08-04 DIAGNOSIS — R197 Diarrhea, unspecified: Secondary | ICD-10-CM

## 2013-08-04 DIAGNOSIS — D126 Benign neoplasm of colon, unspecified: Secondary | ICD-10-CM

## 2013-08-04 DIAGNOSIS — K5730 Diverticulosis of large intestine without perforation or abscess without bleeding: Secondary | ICD-10-CM

## 2013-08-04 MED ORDER — SODIUM CHLORIDE 0.9 % IV SOLN: 500.0000 mL | INTRAVENOUS | Status: DC

## 2013-08-04 NOTE — Op Note (Signed)
[facility] [address] [place], [address]   COLONOSCOPY PROCEDURE REPORT  PATIENT: Daniel Huynh, Daniel Huynh  MR#: [id number] BIRTHDATE: [birth year] , 71  yrs. old GENDER: Male ENDOSCOPIST: Louis Meckel, MD REFERRED BY: PROCEDURE DATE:  08/04/2013 PROCEDURE:   Colonoscopy with biopsy, Colonoscopy with snare polypectomy, and Submucosal injection, any substance First Screening Colonoscopy - Avg.  risk and is 50 yrs.  old or older - No.  Prior Negative Screening - Now for repeat screening. N/A  History of Adenoma - Now for follow-up colonoscopy & has been > or = to 3 yrs.  Yes hx of adenoma.  Has been 3 or more years since last colonoscopy.  Polyps Removed Today? Yes. ASA CLASS:   Class II INDICATIONS:unexplained diarrhea and Patient's personal history of adenomatous colon polyps.2005 MEDICATIONS: MAC sedation, administered by CRNA and propofol (Diprivan) 200mg  IV  DESCRIPTION OF PROCEDURE:   After the risks benefits and alternatives of the procedure were thoroughly explained, informed consent was obtained.  A digital rectal exam revealed no abnormalities of the rectum.   The     endoscope was introduced through the anus and advanced to the cecum, which was identified by both the appendix and ileocecal valve. No adverse events experienced.   The quality of the prep was Suprep good  The instrument was then slowly withdrawn as the colon was fully examined.      COLON FINDINGS: A sessile polyp measuring 2 cm in size was found in the sigmoid colon.  Endoscopic mucosal resection was performed in a piecemeal fashion.  A polypectomy was performed using snare cautery.  The resection was complete and the polyp tissue was completely retrieved.  A tattoo was applied.  Injection (tattooing) was performed.   There was moderate diverticulosis noted in the sigmoid colon and descending colon with associated muscular hypertrophy.   Internal hemorrhoids were found.   The colon  mucosa was otherwise normal. Random biopsies were taken throughout the colon to rule out microscopic slides. Retroflexed views revealed no abnormalities. The time to cecum=3 minutes 02 seconds.  Withdrawal time=16 minutes 13 seconds.  The scope was withdrawn and the procedure completed. COMPLICATIONS: There were no complications.  ENDOSCOPIC IMPRESSION: 1.   Sessile polyp measuring 2 cm in size was found in the sigmoid colon; endoscopic mucosal resection was performed; polypectomy was performed using snare cautery; a tattoo was applied; Injection (tattooing) was performed 2.   There was moderate diverticulosis noted in the sigmoid colon and descending colon 3.   Internal hemorrhoids 4.   The colon mucosa was otherwise normal  RECOMMENDATIONS: 1.  Await biopsy results 2.  Flexible sigmoidoscopy one year pending results 3.  Colonoscopy 3 years 4.  office visit 2-3 weeks   eSigned:  Louis Meckel, MD 08/04/2013 11:13 AM   cc: Ivory Broad MD   PATIENT NAME:  Daniel Huynh, Daniel Huynh MR#: [id number]

## 2013-08-04 NOTE — Patient Instructions (Addendum)

## 2013-08-04 NOTE — Progress Notes (Signed)
Called to room to assist during endoscopic procedure.  Patient ID and intended procedure confirmed with present staff. Received instructions for my participation in the procedure from the performing physician.  

## 2013-08-04 NOTE — Progress Notes (Signed)
Patient did not experience any of the following events: a burn prior to discharge; a fall within the facility; wrong site/side/patient/procedure/implant event; or a hospital transfer or hospital admission upon discharge from the facility. (G8907) Patient did not have preoperative order for IV antibiotic SSI prophylaxis. (G8918)  

## 2013-08-04 NOTE — Progress Notes (Signed)
Stable to RR 

## 2013-08-07 ENCOUNTER — Telehealth: Payer: Self-pay | Admitting: *Deleted

## 2013-08-07 NOTE — Telephone Encounter (Signed)
Left message that we called for f/u 

## 2013-08-14 ENCOUNTER — Encounter: Payer: Self-pay | Admitting: Gastroenterology

## 2013-08-15 ENCOUNTER — Encounter: Payer: Self-pay | Admitting: Internal Medicine

## 2013-08-28 ENCOUNTER — Ambulatory Visit (INDEPENDENT_AMBULATORY_CARE_PROVIDER_SITE_OTHER): Payer: Medicare Other | Admitting: Gastroenterology

## 2013-08-28 ENCOUNTER — Encounter: Payer: Self-pay | Admitting: Gastroenterology

## 2013-08-28 VITALS — BP 98/64 | HR 64 | Ht 70.5 in | Wt 155.5 lb

## 2013-08-28 DIAGNOSIS — Z860100 Personal history of colon polyps, unspecified: Secondary | ICD-10-CM

## 2013-08-28 DIAGNOSIS — R197 Diarrhea, unspecified: Secondary | ICD-10-CM

## 2013-08-28 NOTE — Assessment & Plan Note (Signed)
Diarrhea is well controlled with low-dose Imodium.  Plan to continue with the same.

## 2013-08-28 NOTE — Assessment & Plan Note (Signed)
Plan colonoscopy 2017 

## 2013-08-28 NOTE — Progress Notes (Signed)
History of Present Illness:  [name] has returned following colonoscopy.  This demonstrated several large adenomatous polyps.  Random biopsies were negative.  On a regimen of one Imodium in the morning diarrhea is significantly improved.    Review of Systems: Pertinent positive and negative review of systems were noted in the above HPI section. All other review of systems were otherwise negative.    Current Medications, Allergies, Past Medical History, Past Surgical History, Family History and Social History were reviewed in [employer] electronic medical record  Vital signs were reviewed in today's medical record. Physical Exam: General: Well developed , well nourished, no acute distress

## 2013-08-28 NOTE — Patient Instructions (Signed)
Follow up as needed

## 2013-11-23 ENCOUNTER — Ambulatory Visit: Payer: Medicare Other | Admitting: Internal Medicine

## 2014-01-09 LAB — PULMONARY FUNCTION TEST

## 2014-01-23 ENCOUNTER — Telehealth: Payer: Self-pay | Admitting: Gastroenterology

## 2014-01-23 ENCOUNTER — Ambulatory Visit: Payer: Medicare Other | Admitting: Gastroenterology

## 2014-01-23 NOTE — Telephone Encounter (Signed)
Pt states he is having diarrhea, bloating, and c/o a knot on his abdomen. Pt requesting to be seen. Pt scheduled to see Nicoletta Ba PA Thursday 01/25/14@1 :30pm. Pt aware of appt.

## 2014-01-25 ENCOUNTER — Other Ambulatory Visit (INDEPENDENT_AMBULATORY_CARE_PROVIDER_SITE_OTHER): Payer: Medicare Other

## 2014-01-25 ENCOUNTER — Encounter: Payer: Self-pay | Admitting: Physician Assistant

## 2014-01-25 ENCOUNTER — Ambulatory Visit (INDEPENDENT_AMBULATORY_CARE_PROVIDER_SITE_OTHER): Payer: Medicare Other | Admitting: Physician Assistant

## 2014-01-25 VITALS — BP 92/60 | HR 64 | Ht 70.5 in | Wt 146.2 lb

## 2014-01-25 DIAGNOSIS — R1011 Right upper quadrant pain: Secondary | ICD-10-CM

## 2014-01-25 DIAGNOSIS — R143 Flatulence: Secondary | ICD-10-CM

## 2014-01-25 DIAGNOSIS — R197 Diarrhea, unspecified: Secondary | ICD-10-CM

## 2014-01-25 DIAGNOSIS — Z9889 Other specified postprocedural states: Secondary | ICD-10-CM

## 2014-01-25 DIAGNOSIS — R142 Eructation: Secondary | ICD-10-CM

## 2014-01-25 DIAGNOSIS — R1012 Left upper quadrant pain: Secondary | ICD-10-CM

## 2014-01-25 DIAGNOSIS — R141 Gas pain: Secondary | ICD-10-CM

## 2014-01-25 DIAGNOSIS — G8929 Other chronic pain: Secondary | ICD-10-CM

## 2014-01-25 DIAGNOSIS — R634 Abnormal weight loss: Secondary | ICD-10-CM

## 2014-01-25 DIAGNOSIS — R140 Abdominal distension (gaseous): Secondary | ICD-10-CM

## 2014-01-25 DIAGNOSIS — Z903 Acquired absence of stomach [part of]: Secondary | ICD-10-CM

## 2014-01-25 LAB — CBC WITH DIFFERENTIAL/PLATELET
BASOS ABS: 0 10*3/uL (ref 0.0–0.1)
Basophils Relative: 0.8 % (ref 0.0–3.0)
Eosinophils Absolute: 0.1 10*3/uL (ref 0.0–0.7)
Eosinophils Relative: 2.1 % (ref 0.0–5.0)
HEMATOCRIT: 41.5 % (ref 39.0–52.0)
Hemoglobin: 13.5 g/dL (ref 13.0–17.0)
LYMPHS ABS: 1.4 10*3/uL (ref 0.7–4.0)
Lymphocytes Relative: 28.4 % (ref 12.0–46.0)
MCHC: 32.6 g/dL (ref 30.0–36.0)
MCV: 87.2 fl (ref 78.0–100.0)
MONO ABS: 0.5 10*3/uL (ref 0.1–1.0)
Monocytes Relative: 9.8 % (ref 3.0–12.0)
Neutro Abs: 2.8 10*3/uL (ref 1.4–7.7)
Neutrophils Relative %: 58.9 % (ref 43.0–77.0)
PLATELETS: 221 10*3/uL (ref 150.0–400.0)
RBC: 4.76 Mil/uL (ref 4.22–5.81)
RDW: 14.4 % (ref 11.5–14.6)
WBC: 4.8 10*3/uL (ref 4.5–10.5)

## 2014-01-25 LAB — COMPREHENSIVE METABOLIC PANEL
ALK PHOS: 71 U/L (ref 39–117)
ALT: 46 U/L (ref 0–53)
AST: 43 U/L — ABNORMAL HIGH (ref 0–37)
Albumin: 4 g/dL (ref 3.5–5.2)
BILIRUBIN TOTAL: 1 mg/dL (ref 0.3–1.2)
BUN: 12 mg/dL (ref 6–23)
CO2: 29 mEq/L (ref 19–32)
Calcium: 9.3 mg/dL (ref 8.4–10.5)
Chloride: 111 mEq/L (ref 96–112)
Creatinine, Ser: 1.2 mg/dL (ref 0.4–1.5)
GFR: 79.74 mL/min (ref >60.00)
GLUCOSE: 104 mg/dL — AB (ref 70–99)
Potassium: 4.6 mEq/L (ref 3.5–5.1)
SODIUM: 143 meq/L (ref 135–145)
Total Protein: 6.6 g/dL (ref 6.0–8.3)

## 2014-01-25 LAB — C-REACTIVE PROTEIN: CRP: <0.5 mg/dL (ref 0.5–20.0)

## 2014-01-25 MED ORDER — DICYCLOMINE HCL 10 MG PO CAPS: ORAL_CAPSULE | ORAL | Status: AC

## 2014-01-25 NOTE — Patient Instructions (Addendum)
Please go to the basement level to have your labs drawn.  You have been scheduled for an endoscopy with propofol. Please follow written instructions given to you at your visit today. If you use inhalers (even only as needed), please bring them with you on the day of your procedure. Your physician has requested that you go to www.startemmi.com and enter the access code given to you at your visit today. This web site gives a general overview about your procedure. However, you should still follow specific instructions given to you by our office regarding your preparation for the procedure.  We sent a Bentyl prescription to [facility] [address]   You have been scheduled for a CT scan of the abdomen and pelvis at [facility] ([address]---this is in the same building as [facility]).   You are scheduled on 01-26-2014 Friday at 9:30 am. You should arrive at 9:15 am  prior to your appointment time for registration. Please follow the written instructions below on the day of your exam:  WARNING: IF YOU ARE ALLERGIC TO IODINE/X-RAY DYE, PLEASE NOTIFY RADIOLOGY IMMEDIATELY AT [phone]! YOU WILL BE GIVEN A 13 HOUR PREMEDICATION PREP.  1) Do not eat or drink anything after 5:30 am  (4 hours prior to your test) 2) You have been given 2 bottles of oral contrast to drink. The solution may taste better if refrigerated, but do NOT add ice or any other liquid to this solution. Shake well before drinking.    Drink 1 bottle of contrast @ 7:30 am  (2 hours prior to your exam)  Drink 1 bottle of contrast @ 8:30 am  (1 hour prior to your exam)  You may take any medications as prescribed with a small amount of water except for the following: Metformin, Glucophage, Glucovance, Avandamet, Riomet, Fortamet, Actoplus Met, Janumet, Glumetza or Metaglip. The above medications must be held the day of the exam AND 48 hours after the exam.  The purpose of you drinking the oral contrast is to aid in  the visualization of your intestinal tract. The contrast solution may cause some diarrhea. Before your exam is started, you will be given a small amount of fluid to drink. Depending on your individual set of symptoms, you may also receive an intravenous injection of x-ray contrast/dye. Plan on being at [facility] for 30 minutes or long, depending on the type of exam you are having performed.  If you have any questions regarding your exam or if you need to reschedule, you may call the CT department at [phone] between the hours of 8:00 am and 5:00 pm, Monday-Friday.  ________________________________________________________________________  

## 2014-01-25 NOTE — Progress Notes (Signed)
Subjective:    Patient ID: RUDOLPHO CLAXTON, male    DOB: May 10, 1941, 73 y.o.   MRN: [id number]  HPI  Joanthony is a pleasant 73 year old African American male known to Dr. Deatra Ina. He has a history of diverticular disease and had a diverticulitis complicated by contained perforation in 2013. He did not require surgery. He also has history of IBS and ureteral lithiasis. Colonoscopy had been done in September of 2014 and he was noted to have several large adenomatous colon polyps, random biopsies were also done because of the chronic diarrhea and these were negative for microscopic colitis. He is to have a 3 year interval followup colonoscopy. Patient says that he has had chronic problems with diarrhea for many years. He was given Imodium and then Lomotil after his last colonoscopy and says that the Lomotil had caused increasing straining and some constipation so he stopped it. On review of his old records it appears that he has had a prior Billroth I gastro-jejunostomy and the patient says he had surgery about 45 years ago for peptic ulcer disease. Per an EGD done by Dr. Sharlett Iles in 1984 he does have a Billroth I and was felt to have a dumping syndrome. Patient comes in today with concerns for worsening diarrhea and now with associated weight loss which is new. He says over the past month he has been having difficulty eating,and has developed upper abdominal bloating and distention with meals and he gets a knot type of sensation in his upper abdomen as well. He is not having any vomiting. No fever or chills. He says he been eating less because the diarrhea is been worse and he has lost about 8 pounds. This currently having fairly urgent bowel movements after meals with at least 3-6 bowel movements per day no blood. He and his wife are both very concerned about the weight loss.    Review of Systems  Constitutional: Positive for appetite change and unexpected weight change.  HENT: Negative.   Eyes:  Negative.   Respiratory: Negative.   Cardiovascular: Negative.   Gastrointestinal: Positive for abdominal pain, diarrhea and abdominal distention.  Endocrine: Negative.   Genitourinary: Negative.   Musculoskeletal: Negative.   Neurological: Negative.   Hematological: Negative.   Psychiatric/Behavioral: Negative.    Outpatient Prescriptions Prior to Visit  Medication Sig Dispense Refill  . buPROPion (WELLBUTRIN XL) 150 MG 24 hr tablet Take 150 mg by mouth daily.      Marland Kitchen doxazosin (CARDURA) 8 MG tablet Take 4 mg by mouth at bedtime.        Marland Kitchen doxepin (SINEQUAN) 50 MG capsule Take 50 mg by mouth at bedtime.        . famotidine (PEPCID) 20 MG tablet Take 20 mg by mouth 2 (two) times daily.      Marland Kitchen gabapentin (NEURONTIN) 100 MG capsule Take 100 mg by mouth See admin instructions. 5 times daily for nerve pain      . sildenafil (VIAGRA) 100 MG tablet Take 50 mg by mouth daily as needed.      . Testosterone 20.25 MG/ACT (1.62%) GEL Place 2 application onto the skin daily. Pt uses 2 pumps      . zolpidem (AMBIEN) 10 MG tablet Take 5 mg by mouth at bedtime as needed. sleep       No facility-administered medications prior to visit.   Allergies  Allergen Reactions  . Tylenol With Codeine #3 [Acetaminophen-Codeine] Rash    Patient Active Problem List   Diagnosis  Date Noted  . 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 Diarrhea 08/03/2013  . 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 Cough 06/18/2013  . Other malaise and fatigue 06/15/2013  . Personal history of colonic polyps 08/10/2012  . IBS (irritable bowel syndrome) 08/10/2012  . Diverticulitis with perforation 07/14/2012  . Anxiety   . Depression    History  Substance Use Topics  . Smoking status: Former Smoker -- 0.25 packs/day for 10 years    Types: Cigarettes    Quit date: 11/16/1970  . Smokeless tobacco: Never Used  . Alcohol Use: 0.6 oz/week    1 Glasses of wine per week   family history includes Cancer in his sister; Heart disease in his  mother.     Objective:   Physical Exam    well-developed thin older Afro-American male in no acute distress accompanied by his wife blood pressure 92/60 pulse 64 height 5 foot 10 weight 146. HEENT; nontraumatic normocephalic EOMI PERRLA sclera anicteric, Supple; no JVD, Cardiovascular; regular rate and rhythm with S1-S2 no murmur or gallop, Pulmonary; clear bilaterally, Abdomen; flat, soft he has an upper midline incisional scar and is tender just to the right of the lower portion of that scar there is a soft palpable fullness there which may be a small hernia though I cannot reduce this is slightly tender, No other palpable tenderness no mass or hepatosplenomegaly, Rectal; exam not done, Extremities; no clubbing cyanosis or edema skin warm dry, Psych; mood and affect appropriate        Assessment & Plan:  #66  73 year old African American male with history of chronic diarrhea now progressive with associated weight loss of about 8 pounds. Patient also complaining of upper abdominal discomfort bloating distention postprandially x1 month He has. Diagnosis of IBS however he has had a Billroth I gastrojejunostomy remotely in this may be more of a dumping syndrome. With worsening diarrhea will also consider small bowel bacterial overgrowth. Will need to rule out partial outlet obstruction and anastomotic stenosis and evaluate for occult malignancy given weight loss #2 diverticulosis with history of contained perforated diverticulitis 2013 #3 history of ureteral lithiasis #4 large adenomatous colon polyps on colonoscopy September 2014-to have 3 year  interval followup  Plan; we'll schedule for CT scan of the abdomen and pelvis with contrast Schedule for upper endoscopy with Dr. Michelene Heady discussed in detail with the patient and his wife and they're agreeable to proceed Continue Pepcid 20 mg twice daily Will give him an empiric course of Xifaxan for small bowel bacterial overgrowth samples were  provided today for 7 days treatment Check CBC, see med CRP and TTG Add Bentyl 10 mg by mouth twice daily

## 2014-01-26 ENCOUNTER — Emergency Department (HOSPITAL_COMMUNITY): Payer: Medicare Other

## 2014-01-26 ENCOUNTER — Other Ambulatory Visit: Payer: Self-pay

## 2014-01-26 ENCOUNTER — Encounter (HOSPITAL_COMMUNITY): Payer: Self-pay | Admitting: Emergency Medicine

## 2014-01-26 ENCOUNTER — Emergency Department (HOSPITAL_COMMUNITY)
Admission: EM | Admit: 2014-01-26 | Discharge: 2014-01-26 | Disposition: A | Discharge status: Home / Self Care | Payer: Medicare Other | Attending: Emergency Medicine | Admitting: Emergency Medicine

## 2014-01-26 ENCOUNTER — Ambulatory Visit (INDEPENDENT_AMBULATORY_CARE_PROVIDER_SITE_OTHER)
Admission: RE | Admit: 2014-01-26 | Discharge: 2014-01-26 | Disposition: A | Discharge status: Home / Self Care | Payer: Medicare Other | Source: Ambulatory Visit | Attending: Physician Assistant | Admitting: Physician Assistant

## 2014-01-26 DIAGNOSIS — R634 Abnormal weight loss: Secondary | ICD-10-CM

## 2014-01-26 DIAGNOSIS — Z79899 Other long term (current) drug therapy: Secondary | ICD-10-CM | POA: Insufficient documentation

## 2014-01-26 DIAGNOSIS — F3289 Other specified depressive episodes: Secondary | ICD-10-CM | POA: Insufficient documentation

## 2014-01-26 DIAGNOSIS — R197 Diarrhea, unspecified: Secondary | ICD-10-CM | POA: Insufficient documentation

## 2014-01-26 DIAGNOSIS — K5730 Diverticulosis of large intestine without perforation or abscess without bleeding: Secondary | ICD-10-CM | POA: Insufficient documentation

## 2014-01-26 DIAGNOSIS — F329 Major depressive disorder, single episode, unspecified: Secondary | ICD-10-CM | POA: Insufficient documentation

## 2014-01-26 DIAGNOSIS — F411 Generalized anxiety disorder: Secondary | ICD-10-CM | POA: Insufficient documentation

## 2014-01-26 DIAGNOSIS — R140 Abdominal distension (gaseous): Secondary | ICD-10-CM

## 2014-01-26 DIAGNOSIS — N400 Enlarged prostate without lower urinary tract symptoms: Secondary | ICD-10-CM | POA: Insufficient documentation

## 2014-01-26 DIAGNOSIS — Z8739 Personal history of other diseases of the musculoskeletal system and connective tissue: Secondary | ICD-10-CM | POA: Insufficient documentation

## 2014-01-26 DIAGNOSIS — N200 Calculus of kidney: Secondary | ICD-10-CM | POA: Insufficient documentation

## 2014-01-26 DIAGNOSIS — R1012 Left upper quadrant pain: Secondary | ICD-10-CM

## 2014-01-26 DIAGNOSIS — R141 Gas pain: Secondary | ICD-10-CM

## 2014-01-26 DIAGNOSIS — R1011 Right upper quadrant pain: Secondary | ICD-10-CM

## 2014-01-26 DIAGNOSIS — K5790 Diverticulosis of intestine, part unspecified, without perforation or abscess without bleeding: Secondary | ICD-10-CM

## 2014-01-26 DIAGNOSIS — R142 Eructation: Secondary | ICD-10-CM

## 2014-01-26 DIAGNOSIS — Z8711 Personal history of peptic ulcer disease: Secondary | ICD-10-CM | POA: Insufficient documentation

## 2014-01-26 DIAGNOSIS — R143 Flatulence: Secondary | ICD-10-CM

## 2014-01-26 DIAGNOSIS — K8020 Calculus of gallbladder without cholecystitis without obstruction: Secondary | ICD-10-CM

## 2014-01-26 DIAGNOSIS — Z87891 Personal history of nicotine dependence: Secondary | ICD-10-CM | POA: Insufficient documentation

## 2014-01-26 DIAGNOSIS — G8929 Other chronic pain: Secondary | ICD-10-CM

## 2014-01-26 LAB — URINE MICROSCOPIC-ADD ON

## 2014-01-26 LAB — TROPONIN I: Troponin I: <0.3 ng/mL (ref <0.30)

## 2014-01-26 LAB — COMPREHENSIVE METABOLIC PANEL
ALT: 43 U/L (ref 0–53)
AST: 41 U/L — AB (ref 0–37)
Albumin: 3.9 g/dL (ref 3.5–5.2)
Alkaline Phosphatase: 81 U/L (ref 39–117)
BUN: 14 mg/dL (ref 6–23)
CO2: 22 meq/L (ref 19–32)
Calcium: 9.2 mg/dL (ref 8.4–10.5)
Chloride: 105 mEq/L (ref 96–112)
Creatinine, Ser: 1.44 mg/dL — ABNORMAL HIGH (ref 0.50–1.35)
GFR calc Af Amer: 55 mL/min — ABNORMAL LOW (ref >90)
GFR, EST NON AFRICAN AMERICAN: 47 mL/min — AB (ref >90)
Glucose, Bld: 128 mg/dL — ABNORMAL HIGH (ref 70–99)
POTASSIUM: 4.2 meq/L (ref 3.7–5.3)
SODIUM: 142 meq/L (ref 137–147)
Total Bilirubin: 0.8 mg/dL (ref 0.3–1.2)
Total Protein: 6.7 g/dL (ref 6.0–8.3)

## 2014-01-26 LAB — CBC WITH DIFFERENTIAL/PLATELET
BASOS ABS: 0 10*3/uL (ref 0.0–0.1)
Basophils Relative: 0 % (ref 0–1)
EOS PCT: 0 % (ref 0–5)
Eosinophils Absolute: 0 10*3/uL (ref 0.0–0.7)
HCT: 42.6 % (ref 39.0–52.0)
Hemoglobin: 14.2 g/dL (ref 13.0–17.0)
LYMPHS PCT: 13 % (ref 12–46)
Lymphs Abs: 0.9 10*3/uL (ref 0.7–4.0)
MCH: 28.2 pg (ref 26.0–34.0)
MCHC: 33.3 g/dL (ref 30.0–36.0)
MCV: 84.7 fL (ref 78.0–100.0)
Monocytes Absolute: 0.5 10*3/uL (ref 0.1–1.0)
Monocytes Relative: 8 % (ref 3–12)
NEUTROS PCT: 79 % — AB (ref 43–77)
Neutro Abs: 5.5 10*3/uL (ref 1.7–7.7)
PLATELETS: 146 10*3/uL — AB (ref 150–400)
RBC: 5.03 MIL/uL (ref 4.22–5.81)
RDW: 13.9 % (ref 11.5–15.5)
WBC: 7 10*3/uL (ref 4.0–10.5)

## 2014-01-26 LAB — URINALYSIS, ROUTINE W REFLEX MICROSCOPIC
GLUCOSE, UA: NEGATIVE mg/dL
Ketones, ur: 40 mg/dL — AB
Nitrite: NEGATIVE
PROTEIN: 100 mg/dL — AB
SPECIFIC GRAVITY, URINE: 1.027 (ref 1.005–1.030)
Urobilinogen, UA: 0.2 mg/dL (ref 0.0–1.0)
pH: 5 (ref 5.0–8.0)

## 2014-01-26 MED ORDER — KETOROLAC TROMETHAMINE 15 MG/ML IJ SOLN
30.0000 mg | Freq: Once | INTRAMUSCULAR | Status: AC
  Administered 2014-01-26: 30 mg via INTRAVENOUS
  Filled 2014-01-26: qty 2

## 2014-01-26 MED ORDER — HYDROCODONE-ACETAMINOPHEN 5-325 MG PO TABS: 1.0000 | ORAL_TABLET | ORAL | Status: DC | PRN

## 2014-01-26 MED ORDER — TAMSULOSIN HCL 0.4 MG PO CAPS: 0.4000 mg | ORAL_CAPSULE | Freq: Every day | ORAL | Status: AC

## 2014-01-26 MED ORDER — ONDANSETRON HCL 4 MG/2ML IJ SOLN
4.0000 mg | Freq: Once | INTRAMUSCULAR | Status: AC
  Administered 2014-01-26: 4 mg via INTRAVENOUS
  Filled 2014-01-26: qty 2

## 2014-01-26 MED ORDER — IOHEXOL 300 MG/ML  SOLN
100.0000 mL | Freq: Once | INTRAMUSCULAR | Status: AC | PRN
  Administered 2014-01-26: 100 mL via INTRAVENOUS

## 2014-01-26 MED ORDER — SODIUM CHLORIDE 0.9 % IV BOLUS (SEPSIS)
1000.0000 mL | Freq: Once | INTRAVENOUS | Status: AC
  Administered 2014-01-26: 1000 mL via INTRAVENOUS

## 2014-01-26 MED ORDER — MORPHINE SULFATE 4 MG/ML IJ SOLN
4.0000 mg | Freq: Once | INTRAMUSCULAR | Status: AC
  Administered 2014-01-26: 4 mg via INTRAVENOUS
  Filled 2014-01-26: qty 1

## 2014-01-26 NOTE — ED Provider Notes (Signed)
CSN: [id number]     Arrival date & time 01/26/14  0008 History   First MD Initiated Contact with Patient 01/26/14 [phone]     Chief Complaint  Patient presents with  . Flank Pain  . Emesis   HPI  History provided by the patient and stepmother. The patient is a 73 year old male with history of diverticulitis, peptic ulcer, anxiety and depression who presents with complaints of left back and flank pain with episodes of nausea and vomiting. Patient states that he began having sharp severe left flank pain yesterday in the early evening. This was constant and worsened through the evening and night. It was associated with an episode of vomiting. Patient states that he has had some history of chronic soft diarrhea that is unchanged. Denies any associated fever, chills or sweats. Denies any similar back or flank pains in the past. He does state that he has had some issues of abdominal pains and is planning a test tomorrow to evaluate this. He denies any other aggravating or alleviating factors. No other associated symptoms.    Past Medical History  Diagnosis Date  . Depression   . Anxiety   . Unintentional weight loss   . Generalized headaches   . Diarrhea   . Difficulty urinating   . Arthritis   . Diverticulitis of colon    Past Surgical History  Procedure Laterality Date  . Laparoscopic gastrotomy w/ repair of ulcer  1979   Family History  Problem Relation Age of Onset  . Heart disease Mother   . Cancer Sister     unknown type   History  Substance Use Topics  . Smoking status: Former Smoker -- 0.25 packs/day for 10 years    Types: Cigarettes    Quit date: 11/16/1970  . Smokeless tobacco: Never Used  . Alcohol Use: 0.6 oz/week    1 Glasses of wine per week    Review of Systems  Constitutional: Negative for fever, chills and diaphoresis.  Gastrointestinal: Positive for nausea and vomiting. Negative for abdominal pain and constipation.  Genitourinary: Positive for flank pain.  Negative for dysuria, frequency and hematuria.  Musculoskeletal: Positive for back pain.  All other systems reviewed and are negative.      Allergies  Tylenol with codeine #3  Home Medications   Current Outpatient Rx  Name  Route  Sig  Dispense  Refill  . buPROPion (WELLBUTRIN XL) 150 MG 24 hr tablet   Oral   Take 150 mg by mouth daily.         Marland Kitchen dicyclomine (BENTYL) 10 MG capsule      Take 1 tab twice daily as needed for spasms and cramping, diarrhea.   60 capsule   1   . doxazosin (CARDURA) 8 MG tablet   Oral   Take 4 mg by mouth at bedtime.           Marland Kitchen doxepin (SINEQUAN) 50 MG capsule   Oral   Take 50 mg by mouth at bedtime.           . famotidine (PEPCID) 20 MG tablet   Oral   Take 20 mg by mouth 2 (two) times daily.         Marland Kitchen gabapentin (NEURONTIN) 100 MG capsule   Oral   Take 100 mg by mouth 5 (five) times daily. 5 times daily for nerve pain         . rifaximin (XIFAXAN) 550 MG TABS tablet   Oral   Take  550 mg by mouth 2 (two) times daily.         . Testosterone 20.25 MG/ACT (1.62%) GEL   Transdermal   Place 2 application onto the skin daily. Pt uses 2 pumps         . zolpidem (AMBIEN) 10 MG tablet   Oral   Take 5 mg by mouth at bedtime as needed. sleep         . loperamide (ANTI-DIARRHEAL) 2 MG tablet   Oral   Take 2 mg by mouth 4 (four) times daily as needed for diarrhea or loose stools.         . sildenafil (VIAGRA) 100 MG tablet   Oral   Take 50 mg by mouth daily as needed for erectile dysfunction.           BP 124/66  Pulse 58  Temp(Src) 98.3 F (36.8 C) (Oral)  Resp 16  Ht 5\' 11"  (1.803 m)  Wt 148 lb (67.132 kg)  BMI 20.65 kg/m2  SpO2 99% Physical Exam  Nursing note and vitals reviewed. Constitutional: He is oriented to person, place, and time. He appears well-developed and well-nourished.  HENT:  Head: Normocephalic.  Cardiovascular: Normal rate and regular rhythm.   Pulmonary/Chest: Effort normal and breath  sounds normal. He has no wheezes. He has no rales.  Abdominal: Soft. He exhibits no distension. There is no tenderness. There is no rebound and no guarding.  Left CVA tenderness  Neurological: He is alert and oriented to person, place, and time.  Skin: Skin is warm.  Psychiatric: He has a normal mood and affect. His behavior is normal.    ED Course  Procedures   DIAGNOSTIC STUDIES: Oxygen Saturation is 99% on room air.    COORDINATION OF CARE:  Nursing notes reviewed. Vital signs reviewed. Initial pt interview and examination performed.   2:46 AM-patient seen and evaluated. He appears uncomfortable no acute distress. Discussed work up plan with pt at bedside, which includes CT scan for possible kidney stone. Pt agrees with plan.  Patient feeling significantly better after pain medications. CT scan demonstrates a large 1.2 cm left UVJ stone with hydronephrosis. At this time patient is comfortable. He is stable for discharge home. He has been instructed to call urology later today for close follow up appointment. He will also contact his GI specialist for recommendations on his testing later today.   Treatment plan initiated: Medications  morphine 4 MG/ML injection 4 mg (not administered)  ketorolac (TORADOL) 15 MG/ML injection 30 mg (not administered)  sodium chloride 0.9 % bolus 1,000 mL (not administered)  ondansetron (ZOFRAN) injection 4 mg (not administered)   Results for orders placed during the hospital encounter of 01/26/14  CBC WITH DIFFERENTIAL      Result Value Ref Range   WBC 7.0  4.0 - 10.5 K/uL   RBC 5.03  4.22 - 5.81 MIL/uL   Hemoglobin 14.2  13.0 - 17.0 g/dL   HCT 42.6  39.0 - 52.0 %   MCV 84.7  78.0 - 100.0 fL   MCH 28.2  26.0 - 34.0 pg   MCHC 33.3  30.0 - 36.0 g/dL   RDW 13.9  11.5 - 15.5 %   Platelets 146 (*) 150 - 400 K/uL   Neutrophils Relative % 79 (*) 43 - 77 %   Neutro Abs 5.5  1.7 - 7.7 K/uL   Lymphocytes Relative 13  12 - 46 %   Lymphs Abs 0.9  0.7  - 4.0  K/uL   Monocytes Relative 8  3 - 12 %   Monocytes Absolute 0.5  0.1 - 1.0 K/uL   Eosinophils Relative 0  0 - 5 %   Eosinophils Absolute 0.0  0.0 - 0.7 K/uL   Basophils Relative 0  0 - 1 %   Basophils Absolute 0.0  0.0 - 0.1 K/uL  COMPREHENSIVE METABOLIC PANEL      Result Value Ref Range   Sodium 142  137 - 147 mEq/L   Potassium 4.2  3.7 - 5.3 mEq/L   Chloride 105  96 - 112 mEq/L   CO2 22  19 - 32 mEq/L   Glucose, Bld 128 (*) 70 - 99 mg/dL   BUN 14  6 - 23 mg/dL   Creatinine, Ser 1.44 (*) 0.50 - 1.35 mg/dL   Calcium 9.2  8.4 - 10.5 mg/dL   Total Protein 6.7  6.0 - 8.3 g/dL   Albumin 3.9  3.5 - 5.2 g/dL   AST 41 (*) 0 - 37 U/L   ALT 43  0 - 53 U/L   Alkaline Phosphatase 81  39 - 117 U/L   Total Bilirubin 0.8  0.3 - 1.2 mg/dL   GFR calc non Af Amer 47 (*) >90 mL/min   GFR calc Af Amer 55 (*) >90 mL/min  TROPONIN I      Result Value Ref Range   Troponin I <0.30  <0.30 ng/mL  URINALYSIS, ROUTINE W REFLEX MICROSCOPIC      Result Value Ref Range   Color, Urine RED (*) YELLOW   APPearance TURBID (*) CLEAR   Specific Gravity, Urine 1.027  1.005 - 1.030   pH 5.0  5.0 - 8.0   Glucose, UA NEGATIVE  NEGATIVE mg/dL   Hgb urine dipstick LARGE (*) NEGATIVE   Bilirubin Urine MODERATE (*) NEGATIVE   Ketones, ur 40 (*) NEGATIVE mg/dL   Protein, ur 100 (*) NEGATIVE mg/dL   Urobilinogen, UA 0.2  0.0 - 1.0 mg/dL   Nitrite NEGATIVE  NEGATIVE   Leukocytes, UA SMALL (*) NEGATIVE  URINE MICROSCOPIC-ADD ON      Result Value Ref Range   Squamous Epithelial / LPF RARE  RARE   WBC, UA 3-6  <3 WBC/hpf   RBC / HPF TOO NUMEROUS TO COUNT  <3 RBC/hpf   Bacteria, UA MANY (*) RARE   Urine-Other AMORPHOUS URATES/PHOSPHATES       Imaging Review Ct Abdomen Pelvis Wo Contrast  01/26/2014   CLINICAL DATA:  Left flank pain  EXAM: CT ABDOMEN AND PELVIS WITHOUT CONTRAST  TECHNIQUE: Multidetector CT imaging of the abdomen and pelvis was performed following the standard protocol without intravenous  contrast.  COMPARISON:  Prior CT from 07/13/2012  FINDINGS: Mild subsegmental atelectasis seen within the visualized lung bases. Small pericardial effusion noted.  The liver demonstrates a normal unenhanced appearance. Prominent circumferential wall thickening is seen within the partially distended gallbladder. No pericholecystic fluid or associated inflammatory fat stranding. No biliary ductal dilatation. The spleen, adrenal glands, and pancreas are unremarkable.  A large obstructive stone measuring 1.2 cm and long axis is present at the left UPJ (series 2, image 31). There is fairly severe left hydronephrosis proximally with prominent inflammatory fat stranding within the adjacent left perinephric fat. No other obstructive stone seen along the course of the left ureter distally. Additional 4 mm stone present within the lower pole of the left kidney.  4 mm nonobstructive stone present within the right kidney. No evidence of obstructive uropathy  identified on the right.  Surgical clips noted about the GE junction. No evidence of bowel obstruction. Colonic diverticulosis noted without acute diverticulitis. Appendix is within normal limits.  Mild circumferential bladder wall thickening is present, which may be related to incomplete distension. Prostate is enlarged measuring 5.2 cm in transverse diameter.  No free air or fluid. No enlarged intra-abdominal or pelvic lymph nodes. Scattered aorto bi-iliac atherosclerotic calcifications noted.  Back containing ventral hernia with a small amount of associated fluid is present (series 2, image 36).  No acute osseous abnormality. No focal lytic or blastic osseous lesions.  IMPRESSION: 1. 1.2 cm obstructive left UPJ stone with secondary severe left hydronephrosis and prominent left perinephric fat stranding. 2. Additional bilateral nonobstructive renal calculi as above. 3. Circumferential wall thickening within the partially distended gallbladder. While this finding may be  solely related to incomplete distension and overall volume status, possible acute cholecystitis could also have this appearance. Correlation with right upper quadrant ultrasound could be performed as clinically indicated. 4. Mild circumferential bladder wall thickening, all related to incomplete distension, although possible cystitis could also be considered. Correlation with urinalysis recommended. 5. Enlarged prostate. 6. Colonic diverticulosis without acute diverticulitis. 7. Small pericardial effusion.   Electronically Signed   By: Jeannine Boga M.D.   On: 01/26/2014 03:45      MDM   Final diagnoses:  Kidney stone on left side  Enlarged prostate  Diverticulosis        Martie Lee, PA-C 01/26/14 [phone]

## 2014-01-26 NOTE — Progress Notes (Signed)
Reviewed and agree with management. Lavida Patch D. Ritika Hellickson, M.D., FACG  

## 2014-01-26 NOTE — ED Notes (Signed)
Pt reports that he has been experiencing L sided flank pain for the past month, states he was seen at [facility] on [address] today and told to come in tomorrow for a test after drinking contrast, pt states that at 2130 tonight the pain suddenly increased and become unbearable. Pt states that he vomited x2, reports chronic diarrhea, with no increase.

## 2014-01-26 NOTE — ED Provider Notes (Signed)
Medical screening examination/treatment/procedure(s) were performed by non-physician practitioner and as supervising physician I was immediately available for consultation/collaboration.   EKG Interpretation None       Varney Biles, MD 01/26/14 1904

## 2014-01-26 NOTE — Discharge Instructions (Signed)
You were found to have a 1.2 cm left kidney stone causing your pain today. Please followup with the urology specialist for continued evaluation and treatment. Please also followup with your other doctors and specialists for continued evaluation of your health care. Return at anytime for worsening pain, persistent nausea and vomiting, fever, chills or sweats.     Kidney Stones Kidney stones (urolithiasis) are deposits that form inside your kidneys. The intense pain is caused by the stone moving through the urinary tract. When the stone moves, the ureter goes into spasm around the stone. The stone is usually passed in the urine.  CAUSES   A disorder that makes certain neck glands produce too much parathyroid hormone (primary hyperparathyroidism).  A buildup of uric acid crystals, similar to gout in your joints.  Narrowing (stricture) of the ureter.  A kidney obstruction present at birth (congenital obstruction).  Previous surgery on the kidney or ureters.  Numerous kidney infections. SYMPTOMS   Feeling sick to your stomach (nauseous).  Throwing up (vomiting).  Blood in the urine (hematuria).  Pain that usually spreads (radiates) to the groin.  Frequency or urgency of urination. DIAGNOSIS   Taking a history and physical exam.  Blood or urine tests.  CT scan.  Occasionally, an examination of the inside of the urinary bladder (cystoscopy) is performed. TREATMENT   Observation.  Increasing your fluid intake.  Extracorporeal shock wave lithotripsy This is a noninvasive procedure that uses shock waves to break up kidney stones.  Surgery may be needed if you have severe pain or persistent obstruction. There are various surgical procedures. Most of the procedures are performed with the use of small instruments. Only small incisions are needed to accommodate these instruments, so recovery time is minimized. The size, location, and chemical composition are all important variables  that will determine the proper choice of action for you. Talk to your health care provider to better understand your situation so that you will minimize the risk of injury to yourself and your kidney.  HOME CARE INSTRUCTIONS   Drink enough water and fluids to keep your urine clear or pale yellow. This will help you to pass the stone or stone fragments.  Strain all urine through the provided strainer. Keep all particulate matter and stones for your health care provider to see. The stone causing the pain may be as small as a grain of salt. It is very important to use the strainer each and every time you pass your urine. The collection of your stone will allow your health care provider to analyze it and verify that a stone has actually passed. The stone analysis will often identify what you can do to reduce the incidence of recurrences.  Only take over-the-counter or prescription medicines for pain, discomfort, or fever as directed by your health care provider.  Make a follow-up appointment with your health care provider as directed.  Get follow-up X-rays if required. The absence of pain does not always mean that the stone has passed. It may have only stopped moving. If the urine remains completely obstructed, it can cause loss of kidney function or even complete destruction of the kidney. It is your responsibility to make sure X-rays and follow-ups are completed. Ultrasounds of the kidney can show blockages and the status of the kidney. Ultrasounds are not associated with any radiation and can be performed easily in a matter of minutes. SEEK MEDICAL CARE IF:  You experience pain that is progressive and unresponsive to any pain medicine  you have been prescribed. SEEK IMMEDIATE MEDICAL CARE IF:   Pain cannot be controlled with the prescribed medicine.  You have a fever or shaking chills.  The severity or intensity of pain increases over 18 hours and is not relieved by pain medicine.  You develop a  new onset of abdominal pain.  You feel faint or pass out.  You are unable to urinate. MAKE SURE YOU:   Understand these instructions.  Will watch your condition.  Will get help right away if you are not doing well or get worse. Document Released: 11/02/2005 Document Revised: 07/05/2013 Document Reviewed: 04/05/2013 [facility] Patient Information 2014 [place].

## 2014-01-29 ENCOUNTER — Ambulatory Visit (AMBULATORY_SURGERY_CENTER): Payer: Medicare Other | Admitting: Gastroenterology

## 2014-01-29 ENCOUNTER — Other Ambulatory Visit: Payer: Self-pay | Admitting: Urology

## 2014-01-29 ENCOUNTER — Ambulatory Visit (INDEPENDENT_AMBULATORY_CARE_PROVIDER_SITE_OTHER): Payer: Medicare Other | Admitting: Surgery

## 2014-01-29 ENCOUNTER — Encounter: Payer: Self-pay | Admitting: Gastroenterology

## 2014-01-29 VITALS — BP 154/68 | HR 70 | Temp 97.0°F | Resp 17 | Ht 70.0 in | Wt 146.0 lb

## 2014-01-29 DIAGNOSIS — R109 Unspecified abdominal pain: Secondary | ICD-10-CM

## 2014-01-29 DIAGNOSIS — R1011 Right upper quadrant pain: Secondary | ICD-10-CM

## 2014-01-29 DIAGNOSIS — R197 Diarrhea, unspecified: Secondary | ICD-10-CM

## 2014-01-29 LAB — T-TRANSGLUTAMINASE (TTG) IGG

## 2014-01-29 MED ORDER — SODIUM CHLORIDE 0.9 % IV SOLN: 500.0000 mL | INTRAVENOUS | Status: DC

## 2014-01-29 NOTE — Progress Notes (Signed)
A/ox3 pleased with MAC, report to Jane RN 

## 2014-01-29 NOTE — Patient Instructions (Signed)
YOU HAD AN ENDOSCOPIC PROCEDURE TODAY AT [facility]: Refer to the procedure report that was given to you for any specific questions about what was found during the examination.  If the procedure report does not answer your questions, please call your gastroenterologist to clarify.  If you requested that your care partner not be given the details of your procedure findings, then the procedure report has been included in a sealed envelope for you to review at your convenience later.  YOU SHOULD EXPECT: Some feelings of bloating in the abdomen. Passage of more gas than usual.  Walking can help get rid of the air that was put into your GI tract during the procedure and reduce the bloating. If you had a lower endoscopy (such as a colonoscopy or flexible sigmoidoscopy) you may notice spotting of blood in your stool or on the toilet paper. If you underwent a bowel prep for your procedure, then you may not have a normal bowel movement for a few days.  DIET: Your first meal following the procedure should be a light meal and then it is ok to progress to your normal diet.  A half-sandwich or bowl of soup is an example of a good first meal.  Heavy or fried foods are harder to digest and may make you feel nauseous or bloated.  Likewise meals heavy in dairy and vegetables can cause extra gas to form and this can also increase the bloating.  Drink plenty of fluids but you should avoid alcoholic beverages for 24 hours.  ACTIVITY: Your care partner should take you home directly after the procedure.  You should plan to take it easy, moving slowly for the rest of the day.  You can resume normal activity the day after the procedure however you should NOT DRIVE or use heavy machinery for 24 hours (because of the sedation medicines used during the test).    SYMPTOMS TO REPORT IMMEDIATELY: A gastroenterologist can be reached at any hour.  During normal business hours, 8:30 AM to 5:00 PM Monday through Friday,  call [phone].  After hours and on weekends, please call the GI answering service at [phone] who will take a message and have the physician on call contact you.     Following upper endoscopy (EGD)  Vomiting of blood or coffee ground material  New chest pain or pain under the shoulder blades  Painful or persistently difficult swallowing  New shortness of breath  Fever of 100F or higher  Black, tarry-looking stools  FOLLOW UP: If any biopsies were taken you will be contacted by phone or by letter within the next 1-3 weeks.  Call your gastroenterologist if you have not heard about the biopsies in 3 weeks.  Our staff will call the home number listed on your records the next business day following your procedure to check on you and address any questions or concerns that you may have at that time regarding the information given to you following your procedure. This is a courtesy call and so if there is no answer at the home number and we have not heard from you through the emergency physician on call, we will assume that you have returned to your regular daily activities without incident.  SIGNATURES/CONFIDENTIALITY:  Gastritis and stricture information given.  Await biopsy results.  See Dr. Deatra Ina in his office in 3-4 weeks, they will call you and schedule an appointment.  Your CT scan showed a dilated bile duct.   You have  been sheduled for a MRCP next Monday, March, 23rd at 8:45AM.  Test will be at 9:00AM.   Do not eat anything After midnight on Sunday.  Report to [facility] and go to Radiology department.  Dr. Deatra Ina recommends Dr. Gayla Doss as a doctor that can help you with your urology issue.  His phone is [phone].  His address is [address] on the [address].   You and/or your care partner have signed paperwork which will be entered into your electronic medical record.  These signatures attest to the fact that that the  information above on your After Visit Summary has been reviewed and is understood.  Full responsibility of the confidentiality of this discharge information lies with you and/or your care-partner.

## 2014-01-29 NOTE — Progress Notes (Signed)
Called to room to assist during endoscopic procedure.  Patient ID and intended procedure confirmed with present staff. Received instructions for my participation in the procedure from the performing physician.  

## 2014-01-29 NOTE — Op Note (Signed)
[facility]  [address] [place], [address]   ENDOSCOPY PROCEDURE REPORT  PATIENT: Daniel Huynh, Daniel Huynh  MR#: [id number] BIRTHDATE: January 18, 1941 , 72  yrs. old GENDER: Male ENDOSCOPIST: Inda Castle, MD REFERRED BY: PROCEDURE DATE:  01/29/2014 PROCEDURE:  EGD w/ biopsy ASA CLASS:     Class II INDICATIONS:  Chronic diarrhea.   Weight loss. MEDICATIONS: MAC sedation, administered by CRNA, Propofol (Diprivan) 180 mg IV, and Simethicone 0.6cc PO TOPICAL ANESTHETIC:  DESCRIPTION OF PROCEDURE: After the risks benefits and alternatives of the procedure were thoroughly explained, informed consent was obtained.  The LB [id number] endoscope was introduced through the mouth and advanced to the third portion of the duodenum. Without limitations.  The instrument was slowly withdrawn as the mucosa was fully examined.      There was a nonobstructing stricture at the GE junction. A Billroth I anastomosis was present.  Anastomosis was normal. Duodenum was unremarkable.  Biopsies were taken in the duodenum to rule out celiac sprue. There were nonerosive inflammatory changes throughout the stomach. There was a nonobstructing stricture at the GE junction. A Billroth I anastomosis was present.  Anastomosis was normal. Duodenum was unremarkable.  Biopsies were taken in the duodenum to rule out celiac sprue. There were nonerosive inflammatory changes throughout the stomach. Retroflexed views revealed no abnormalities.     The scope was then withdrawn from the patient and the procedure completed.  COMPLICATIONS: There were no complications. ENDOSCOPIC IMPRESSION: 1.   nonobstructing esophageal stricture 2.  nonerosive gastritis 3.  postoperative changes  RECOMMENDATIONS: Await biopsy results office visit 3-4 weeks  REPEAT EXAM:  eSigned:  Inda Castle, MD 01/29/2014 3:02 PM   IR:WERX, Gerald Stabs MD  PATIENT NAME:  [name], [name] MR#: [id number]

## 2014-01-30 ENCOUNTER — Ambulatory Visit (HOSPITAL_BASED_OUTPATIENT_CLINIC_OR_DEPARTMENT_OTHER): Payer: Medicare Other | Admitting: Anesthesiology

## 2014-01-30 ENCOUNTER — Ambulatory Visit (HOSPITAL_BASED_OUTPATIENT_CLINIC_OR_DEPARTMENT_OTHER)
Admission: RE | Admit: 2014-01-30 | Discharge: 2014-01-30 | Disposition: A | Discharge status: Home / Self Care | Payer: Medicare Other | Source: Ambulatory Visit | Attending: Urology | Admitting: Urology

## 2014-01-30 ENCOUNTER — Encounter (HOSPITAL_BASED_OUTPATIENT_CLINIC_OR_DEPARTMENT_OTHER)
Admission: RE | Disposition: A | Discharge status: Home / Self Care | Payer: Self-pay | Source: Ambulatory Visit | Attending: Urology

## 2014-01-30 ENCOUNTER — Encounter (HOSPITAL_BASED_OUTPATIENT_CLINIC_OR_DEPARTMENT_OTHER): Payer: Medicare Other | Admitting: Anesthesiology

## 2014-01-30 ENCOUNTER — Telehealth: Payer: Self-pay

## 2014-01-30 ENCOUNTER — Encounter (HOSPITAL_BASED_OUTPATIENT_CLINIC_OR_DEPARTMENT_OTHER): Payer: Self-pay | Admitting: *Deleted

## 2014-01-30 DIAGNOSIS — N1330 Unspecified hydronephrosis: Secondary | ICD-10-CM | POA: Insufficient documentation

## 2014-01-30 DIAGNOSIS — N200 Calculus of kidney: Secondary | ICD-10-CM | POA: Insufficient documentation

## 2014-01-30 DIAGNOSIS — Z87891 Personal history of nicotine dependence: Secondary | ICD-10-CM | POA: Insufficient documentation

## 2014-01-30 DIAGNOSIS — N201 Calculus of ureter: Secondary | ICD-10-CM | POA: Insufficient documentation

## 2014-01-30 HISTORY — DX: Unspecified asthma, uncomplicated: J45.909

## 2014-01-30 HISTORY — PX: HOLMIUM LASER APPLICATION: SHX5852

## 2014-01-30 HISTORY — PX: CYSTOSCOPY WITH RETROGRADE PYELOGRAM, URETEROSCOPY AND STENT PLACEMENT: SHX5789

## 2014-01-30 HISTORY — DX: Gastro-esophageal reflux disease without esophagitis: K21.9

## 2014-01-30 SURGERY — CYSTOURETEROSCOPY, WITH RETROGRADE PYELOGRAM AND STENT INSERTION
Anesthesia: General | Site: Ureter | Laterality: Left

## 2014-01-30 MED ORDER — IOHEXOL 350 MG/ML SOLN
INTRAVENOUS | Status: DC | PRN
  Administered 2014-01-30: 12 mL

## 2014-01-30 MED ORDER — PROPOFOL 10 MG/ML IV BOLUS
INTRAVENOUS | Status: DC | PRN
  Administered 2014-01-30: 150 mg via INTRAVENOUS

## 2014-01-30 MED ORDER — OXYBUTYNIN CHLORIDE 5 MG PO TABS
5.0000 mg | ORAL_TABLET | Freq: Once | ORAL | Status: AC
  Administered 2014-01-30: 5 mg via ORAL
  Filled 2014-01-30: qty 1

## 2014-01-30 MED ORDER — PHENAZOPYRIDINE HCL 100 MG PO TABS
100.0000 mg | ORAL_TABLET | Freq: Once | ORAL | Status: AC
  Administered 2014-01-30: 100 mg via ORAL
  Filled 2014-01-30: qty 1

## 2014-01-30 MED ORDER — OXYBUTYNIN CHLORIDE 5 MG PO TABS: 5.0000 mg | ORAL_TABLET | Freq: Four times a day (QID) | ORAL | Status: AC | PRN

## 2014-01-30 MED ORDER — DEXAMETHASONE SODIUM PHOSPHATE 4 MG/ML IJ SOLN
INTRAMUSCULAR | Status: DC | PRN
  Administered 2014-01-30: 10 mg via INTRAVENOUS

## 2014-01-30 MED ORDER — PHENAZOPYRIDINE HCL 100 MG PO TABS
100.0000 mg | ORAL_TABLET | Freq: Three times a day (TID) | ORAL | Status: AC | PRN
Start: 2014-01-30 — End: ?

## 2014-01-30 MED ORDER — CEPHALEXIN 500 MG PO CAPS: 500.0000 mg | ORAL_CAPSULE | Freq: Three times a day (TID) | ORAL | Status: DC

## 2014-01-30 MED ORDER — FENTANYL CITRATE 0.05 MG/ML IJ SOLN
INTRAMUSCULAR | Status: AC
  Filled 2014-01-30: qty 4

## 2014-01-30 MED ORDER — BELLADONNA ALKALOIDS-OPIUM 16.2-60 MG RE SUPP
RECTAL | Status: AC
  Filled 2014-01-30: qty 1

## 2014-01-30 MED ORDER — OXYBUTYNIN CHLORIDE 5 MG PO TABS
ORAL_TABLET | ORAL | Status: AC
  Filled 2014-01-30: qty 1

## 2014-01-30 MED ORDER — LACTATED RINGERS IV SOLN
INTRAVENOUS | Status: DC
  Administered 2014-01-30 (×3): via INTRAVENOUS
  Filled 2014-01-30: qty 1000

## 2014-01-30 MED ORDER — BELLADONNA ALKALOIDS-OPIUM 16.2-60 MG RE SUPP
RECTAL | Status: DC | PRN
  Administered 2014-01-30: 1 via RECTAL

## 2014-01-30 MED ORDER — KETOROLAC TROMETHAMINE 30 MG/ML IJ SOLN
INTRAMUSCULAR | Status: DC | PRN
  Administered 2014-01-30: 15 mg via INTRAVENOUS

## 2014-01-30 MED ORDER — PHENAZOPYRIDINE HCL 100 MG PO TABS
ORAL_TABLET | ORAL | Status: AC
  Filled 2014-01-30: qty 1

## 2014-01-30 MED ORDER — FENTANYL CITRATE 0.05 MG/ML IJ SOLN
25.0000 ug | INTRAMUSCULAR | Status: DC | PRN
  Filled 2014-01-30: qty 1

## 2014-01-30 MED ORDER — LACTATED RINGERS IV SOLN
INTRAVENOUS | Status: DC
  Filled 2014-01-30: qty 1000

## 2014-01-30 MED ORDER — LIDOCAINE HCL (CARDIAC) 20 MG/ML IV SOLN
INTRAVENOUS | Status: DC | PRN
  Administered 2014-01-30: 60 mg via INTRAVENOUS

## 2014-01-30 MED ORDER — SODIUM CHLORIDE 0.9 % IR SOLN
Status: DC | PRN
  Administered 2014-01-30: 4000 mL

## 2014-01-30 MED ORDER — FENTANYL CITRATE 0.05 MG/ML IJ SOLN
INTRAMUSCULAR | Status: DC | PRN
  Administered 2014-01-30 (×6): 25 ug via INTRAVENOUS
  Administered 2014-01-30: 50 ug via INTRAVENOUS

## 2014-01-30 MED ORDER — ACETAMINOPHEN 10 MG/ML IV SOLN
INTRAVENOUS | Status: DC | PRN
  Administered 2014-01-30: 1000 mg via INTRAVENOUS

## 2014-01-30 MED ORDER — ONDANSETRON HCL 4 MG/2ML IJ SOLN
INTRAMUSCULAR | Status: DC | PRN
  Administered 2014-01-30: 4 mg via INTRAVENOUS

## 2014-01-30 MED ORDER — CEFAZOLIN SODIUM-DEXTROSE 2-3 GM-% IV SOLR
2.0000 g | INTRAVENOUS | Status: AC
Start: 2014-01-30 — End: 2014-01-30
  Administered 2014-01-30: 2 g via INTRAVENOUS
  Filled 2014-01-30: qty 50

## 2014-01-30 SURGICAL SUPPLY — 45 items
BAG DRAIN URO-CYSTO SKYTR STRL (DRAIN) ×3 IMPLANT
BASKET LASER NITINOL 1.9FR (BASKET) IMPLANT
BASKET STNLS GEMINI 4WIRE 3FR (BASKET) IMPLANT
BASKET STONE NCOMPASS (UROLOGICAL SUPPLIES) ×3 IMPLANT
BASKET ZERO TIP NITINOL 2.4FR (BASKET) IMPLANT
CANISTER SUCT LVC 12 LTR MEDI- (MISCELLANEOUS) ×3 IMPLANT
CATH CLEAR GEL 3F BACKSTOP (CATHETERS) ×3 IMPLANT
CATH INTERMIT  6FR 70CM (CATHETERS) IMPLANT
CATH URET 5FR 28IN CONE TIP (BALLOONS)
CATH URET 5FR 28IN OPEN ENDED (CATHETERS) ×3 IMPLANT
CATH URET 5FR 70CM CONE TIP (BALLOONS) IMPLANT
CATH URET DUAL LUMEN 6-10FR 50 (CATHETERS) IMPLANT
CLOTH BEACON ORANGE TIMEOUT ST (SAFETY) ×3 IMPLANT
DRAPE CAMERA CLOSED 9X96 (DRAPES) ×3 IMPLANT
ELECT REM PT RETURN 9FT ADLT (ELECTROSURGICAL)
ELECTRODE REM PT RTRN 9FT ADLT (ELECTROSURGICAL) IMPLANT
EXTRACTOR STONE NITINOL NGAGE (UROLOGICAL SUPPLIES) ×3 IMPLANT
FIBER LASER FLEXIVA 200 (UROLOGICAL SUPPLIES) ×3 IMPLANT
FIBER LASER FLEXIVA 365 (UROLOGICAL SUPPLIES) IMPLANT
GLOVE BIO SURGEON STRL SZ 6.5 (GLOVE) ×2 IMPLANT
GLOVE BIO SURGEON STRL SZ7 (GLOVE) ×3 IMPLANT
GLOVE BIO SURGEONS STRL SZ 6.5 (GLOVE) ×1
GLOVE ECLIPSE 7.0 STRL STRAW (GLOVE) ×3 IMPLANT
GLOVE INDICATOR 7.5 STRL GRN (GLOVE) ×6 IMPLANT
GOWN PREVENTION PLUS LG XLONG (DISPOSABLE) IMPLANT
GOWN STRL REUS W/ TWL LRG LVL3 (GOWN DISPOSABLE) ×1 IMPLANT
GOWN STRL REUS W/TWL LRG LVL3 (GOWN DISPOSABLE) ×2
GOWN STRL REUS W/TWL XL LVL3 (GOWN DISPOSABLE) ×3 IMPLANT
GUIDEWIRE 0.038 PTFE COATED (WIRE) IMPLANT
GUIDEWIRE ANG ZIPWIRE 038X150 (WIRE) IMPLANT
GUIDEWIRE STR DUAL SENSOR (WIRE) ×6 IMPLANT
IV NS 1000ML (IV SOLUTION) ×8
IV NS 1000ML BAXH (IV SOLUTION) ×4 IMPLANT
IV NS IRRIG 3000ML ARTHROMATIC (IV SOLUTION) IMPLANT
KIT BALLIN UROMAX 15FX10 (LABEL) IMPLANT
KIT BALLN UROMAX 15FX4 (MISCELLANEOUS) IMPLANT
KIT BALLN UROMAX 26 75X4 (MISCELLANEOUS)
PACK CYSTOSCOPY (CUSTOM PROCEDURE TRAY) ×3 IMPLANT
SET HIGH PRES BAL DIL (LABEL)
SHEATH ACCESS URETERAL 38CM (SHEATH) ×3 IMPLANT
SHEATH ACCESS URETERAL 54CM (SHEATH) IMPLANT
SHEATH URET ACCESS 12FR/35CM (UROLOGICAL SUPPLIES) IMPLANT
SHEATH URET ACCESS 12FR/55CM (UROLOGICAL SUPPLIES) IMPLANT
STENT POLARIS 6X26 (STENTS) ×3 IMPLANT
SYRINGE IRR TOOMEY STRL 70CC (SYRINGE) IMPLANT

## 2014-01-30 NOTE — Discharge Instructions (Signed)
DISCHARGE INSTRUCTIONS FOR KIDNEY STONES OR URETERAL STENT  MEDICATIONS:   1.Resume all your other meds from home.  ACTIVITY 1. No strenuous activity x 1week 2. No driving while on narcotic pain medications 3. Drink plenty of water 4. Continue to walk at home - you can still get blood clots when you are at home, so keep active, but don't over do it. 5. May return to work in 3 days.  BATHING 1. You can shower or take a bath. SIGNS/SYMPTOMS TO CALL: 1. Please call us if you have a fever greater than 101.5, uncontrolled  nausea/vomiting, uncontrolled pain, dizziness, unable to urinate, chest pain, shortness of breath, leg swelling, leg pain, redness around wound, drainage from wound, or any other concerns or questions.  You can reach [place] at [phone].  [facility] [phone] Post Ureteroscopy With or Without Stent Instructions  Definitions:  Ureter: The duct that transports urine from the kidney to the bladder. Stent:   A plastic hollow tube that is placed into the ureter, from the kidney to the                 bladder to prevent the ureter from swelling shut.  GENERAL INSTRUCTIONS:  Despite the fact that no skin incisions were used, the area around the ureter and bladder is raw and irritated. The stent is a foreign body which will further irritate the bladder wall. This irritation is manifested by increased frequency of urination, both day and night, and by an increase in the urge to urinate. In some, the urge to urinate is present almost always. Sometimes the urge is strong enough that you may not be able to stop yourself from urinating. The only real cure is to remove the stent and then give time for the bladder wall to heal which can't be done until the danger of the ureter swelling shut has passed, which varies.  You may see some blood in your urine while the stent is in place and a few days afterwards. Do not be alarmed, even if the urine was clear for a  while. Get off your feet and drink lots of fluids until clearing occurs. If you start to pass clots or don't improve, call us.  DIET: You may return to your normal diet immediately. Because of the raw surface of your bladder, alcohol, spicy foods, acid type foods and drinks with caffeine may cause irritation or frequency and should be used in moderation. To keep your urine flowing freely and to avoid constipation, drink plenty of fluids during the day ( 8-10 glasses ). Tip: Avoid cranberry juice because it is very acidic.  ACTIVITY: Your physical activity doesn't need to be restricted. However, if you are very active, you may see some blood in your urine. We suggest that you reduce your activity under these circumstances until the bleeding has stopped.  BOWELS: It is important to keep your bowels regular during the postoperative period. Straining with bowel movements can cause bleeding. A bowel movement every other day is reasonable. Use a mild laxative if needed, such as Milk of Magnesia 2-3 tablespoons, or 2 Dulcolax tablets. Call if you continue to have problems. If you have been taking narcotics for pain, before, during or after your surgery, you may be constipated. Take a laxative if necessary.  MEDICATION: You should resume your pre-surgery medications unless told not to. In addition you will often be given an antibiotic to prevent infection. These should be taken as prescribed until the bottles  are finished unless you are having an unusual reaction to one of the drugs.  PROBLEMS YOU SHOULD REPORT TO [place]:  Fevers over 100.5 Fahrenheit.  Heavy bleeding, or clots ( See above notes about blood in urine ).  Inability to urinate.  Drug reactions ( hives, rash, nausea, vomiting, diarrhea ).  Severe burning or pain with urination that is not improving.  FOLLOW-UP: You will need a follow-up appointment to monitor your progress. Call for this appointment at the number listed above. Usually  the first appointment will be about three to fourteen days after your surgery.   Post Anesthesia Home Care Instructions  Activity: Get plenty of rest for the remainder of the day. A responsible adult should stay with you for 24 hours following the procedure.  For the next 24 hours, DO NOT: -Drive a car -Paediatric nurse -Drink alcoholic beverages -Take any medication unless instructed by your physician -Make any legal decisions or sign important papers.  Meals: Start with liquid foods such as gelatin or soup. Progress to regular foods as tolerated. Avoid greasy, spicy, heavy foods. If nausea and/or vomiting occur, drink only clear liquids until the nausea and/or vomiting subsides. Call your physician if vomiting continues.  Special Instructions/Symptoms: Your throat may feel dry or sore from the anesthesia or the breathing tube placed in your throat during surgery. If this causes discomfort, gargle with warm salt water. The discomfort should disappear within 24 hours.

## 2014-01-30 NOTE — H&P (Signed)
Urology History and Physical Exam  CC: Left ureter stone. Left nephrolithiasis.  HPI:  73 year old male previously seen by Dr. Jeffie Pollock in 2013 presents today for several problems.   #1 nephrolithiasis.  There is a chronic problem.  CT done 01/26/14 reveals bilateral stones.  There is definitely a stone in the right mid pole kidney versus a vascular calcification.  There is possibly a stone in the left lower pole which is 3 mm in size.  Neither of these are obstructing.  #2 left ureter stone.  This is a large stone.  It is associated with hydronephrosis.  There is also perinephric stranding.  This was discovered on CT scan 01/26/14.  UA that day showed negative nitrites, small leukocyte esterase, and large hemoglobin.  No culture was done. Size: The stone is 9 x 9 mm.  Density: 484 HU.  UA 01/29/14 is negative for signs of infection.  He is nothing by mouth since 8:00 this morning when he had some broth.  This stone is associated with left-sided flank pain.  This is controlled with narcotics.  It is not associated with fever.  The stone was not clearly visible 01/29/14 on KUB.     PMH: Past Medical History  Diagnosis Date  . Depression   . Anxiety   . Unintentional weight loss   . Generalized headaches   . Diarrhea   . Difficulty urinating   . Arthritis   . Diverticulitis of colon     PSH: Past Surgical History  Procedure Laterality Date  . Laparoscopic gastrotomy w/ repair of ulcer  1979    Allergies: Allergies  Allergen Reactions  . Tylenol With Codeine #3 [Acetaminophen-Codeine] Rash    Medications: No prescriptions prior to admission     Social History: History   Social History  . Marital Status: Married    Spouse Name: N/A    Number of Children: N/A  . Years of Education: N/A   Occupational History  . Not on file.   Social History Main Topics  . Smoking status: Former Smoker -- 0.25 packs/day for 10 years    Types: Cigarettes    Quit date: 11/16/1970  .  Smokeless tobacco: Never Used  . Alcohol Use: 0.6 oz/week    1 Glasses of wine per week  . Drug Use: No  . Sexual Activity: Not on file   Other Topics Concern  . Not on file   Social History Narrative  . No narrative on file    Family History: Family History  Problem Relation Age of Onset  . Heart disease Mother   . Cancer Sister     unknown type    Review of Systems: Positive: Left flank pain. Negative: Fever, SOB, or chest pain.  A further 10 point review of systems was negative except what is listed in the HPI.  Physical Exam: Filed Vitals:   01/30/14 1028  BP: 138/83  Pulse: 67  Temp: 97.3 F (36.3 C)  Resp: 16    General: No acute distress.  Awake. Head:  Normocephalic.  Atraumatic. ENT:  EOMI.  Mucous membranes moist Neck:  Supple.  No lymphadenopathy. CV:  S1 present. S2 present. Regular rate. Pulmonary: Equal effort bilaterally.  Clear to auscultation bilaterally. Abdomen: Soft.  Non- tender to palpation. Skin:  Normal turgor.  No visible rash. Extremity: No gross deformity of bilateral upper extremities.  No gross deformity of    bilateral lower extremities. Neurologic: Alert. Appropriate mood.  .  Studies:  No results  found for this basename: HGB, WBC, PLT,  in the last 72 hours  No results found for this basename: NA, K, CL, CO2, BUN, CREATININE, CALCIUM, MAGNESIUM, GFRNONAA, GFRAA,  in the last 72 hours   No results found for this basename: PT, INR, APTT,  in the last 72 hours   No components found with this basename: ABG,     Assessment:  Left ureter stone. Left nephrolithiasis.  Plan: To OR for cystoscopy, left retrograde program, left ureteroscopy, laser lithotripsy, possible left ureter stent placement.   We will plan on removing the left ureter stone as well as search for the small left lower pole kidney stone which may or may not be present.

## 2014-01-30 NOTE — Telephone Encounter (Signed)
  Follow up Call-  Call back number 01/29/2014 08/04/2013  Post procedure Call Back phone  # [phone]  Permission to leave phone message Yes Yes     Patient questions:  Do you have a fever, pain , or abdominal swelling? no Pain Score  0 *  Have you tolerated food without any problems? yes  Have you been able to return to your normal activities? yes  Do you have any questions about your discharge instructions: Diet   no Medications  no Follow up visit  no  Do you have questions or concerns about your Care? no  Actions: * If pain score is 4 or above: No action needed, pain <4.

## 2014-01-30 NOTE — Anesthesia Postprocedure Evaluation (Signed)
  Anesthesia Post-op Note  Patient: Daniel Huynh  Procedure(s) Performed: Procedure(s) (LRB): CYSTOSCOPY WITH RETROGRADE PYELOGRAM, URETEROSCOPY AND STENT PLACEMENT (Left) HOLMIUM LASER APPLICATION (Left)  Patient Location: PACU  Anesthesia Type: General  Level of Consciousness: awake and alert   Airway and Oxygen Therapy: Patient Spontanous Breathing  Post-op Pain: mild  Post-op Assessment: Post-op Vital signs reviewed, Patient's Cardiovascular Status Stable, Respiratory Function Stable, Patent Airway and No signs of Nausea or vomiting  Last Vitals:  Filed Vitals:   01/30/14 1730  BP: 140/74  Pulse: 58  Temp: 37.2 C  Resp: 12    Post-op Vital Signs: stable   Complications: No apparent anesthesia complications

## 2014-01-30 NOTE — Transfer of Care (Signed)
Immediate Anesthesia Transfer of Care Note  Patient: Daniel Huynh  Procedure(s) Performed: Procedure(s) (LRB): CYSTOSCOPY WITH RETROGRADE PYELOGRAM, URETEROSCOPY AND STENT PLACEMENT (Left) HOLMIUM LASER APPLICATION (Left)  Patient Location: PACU  Anesthesia Type: General  Level of Consciousness: awake, alert  and oriented  Airway & Oxygen Therapy: Patient Spontanous Breathing and Patient connected to face mask oxygen  Post-op Assessment: Report given to PACU RN and Post -op Vital signs reviewed and stable  Post vital signs: Reviewed and stable  Complications: No apparent anesthesia complications

## 2014-01-30 NOTE — Anesthesia Preprocedure Evaluation (Addendum)
Anesthesia Evaluation  Patient identified by MRN, date of birth, ID band Patient awake    Reviewed: Allergy & Precautions, H&P , NPO status , Patient's Chart, lab work & pertinent test results  Airway Mallampati: II TM Distance: >3 FB Neck ROM: full    Dental  (+) Edentulous Upper, Missing, Dental Advisory Given Missing all right side lower too:   Pulmonary asthma , former smoker,  Patient not aware that he has any asthma breath sounds clear to auscultation  Pulmonary exam normal       Cardiovascular Exercise Tolerance: Good negative cardio ROS  Rhythm:regular Rate:Normal     Neuro/Psych  Headaches, Anxiety Depression negative neurological ROS  negative psych ROS   GI/Hepatic negative GI ROS, Neg liver ROS,   Endo/Other  negative endocrine ROS  Renal/GU negative Renal ROS  negative genitourinary   Musculoskeletal   Abdominal   Peds  Hematology negative hematology ROS (+)   Anesthesia Other Findings   Reproductive/Obstetrics negative OB ROS                          Anesthesia Physical Anesthesia Plan  ASA: II  Anesthesia Plan: General   Post-op Pain Management:    Induction: Intravenous  Airway Management Planned: LMA  Additional Equipment:   Intra-op Plan:   Post-operative Plan:   Informed Consent: I have reviewed the patients History and Physical, chart, labs and discussed the procedure including the risks, benefits and alternatives for the proposed anesthesia with the patient or authorized representative who has indicated his/her understanding and acceptance.   Dental Advisory Given  Plan Discussed with: CRNA and Surgeon  Anesthesia Plan Comments:         Anesthesia Quick Evaluation

## 2014-01-30 NOTE — Anesthesia Procedure Notes (Signed)
Procedure Name: LMA Insertion Date/Time: 01/30/2014 3:45 PM Performed by: Mechele Claude Pre-anesthesia Checklist: Patient identified, Emergency Drugs available, Suction available and Patient being monitored Patient Re-evaluated:Patient Re-evaluated prior to inductionOxygen Delivery Method: Circle System Utilized Preoxygenation: Pre-oxygenation with 100% oxygen Intubation Type: IV induction Ventilation: Mask ventilation without difficulty LMA: LMA inserted LMA Size: 4.0 Number of attempts: 1 Airway Equipment and Method: bite block Placement Confirmation: positive ETCO2 Tube secured with: Tape Dental Injury: Teeth and Oropharynx as per pre-operative assessment

## 2014-01-30 NOTE — Op Note (Signed)
Urology Operative Report  Date of Procedure: 01/30/14  Surgeon: Rolan Bucco, MD Assistant:  None  Preoperative Diagnosis: Left ureter stone. Left nephrolithiasis Postoperative Diagnosis: Left ureter stone.  Procedure(s): Left ureteroscopy with laser lithotripsy and stone removal with left ureter stent placement (6 x 26 polaris, no tether). Left retrograde pyelogram with interpretation. Cystoscopy.  Estimated blood loss: Minimal  Specimen: Stones sent to [facility] lab for chemical analysis.  Drains: None  Complications: None  Findings: Large left proximal ureter stone. No left nephrolithiasis.  History of present illness: 73 year old male presents today for symptomatic left proximal ureter stone and possible left nephrolithiasis. He elected to proceed with ureteroscopy.   Procedure in detail: After informed consent was obtained, the patient was taken to the operating room. They were placed in the supine position. SCDs were turned on and in place. IV antibiotics were infused, and general anesthesia was induced. A timeout was performed in which the correct patient, surgical site, and procedure were identified and agreed upon by the team.  The patient was placed in a dorsolithotomy position, making sure to pad all pertinent neurovascular pressure points. A belladonna and opium suppository was placed into the rectum. The genitals were prepped and draped in the usual sterile fashion.  A rigid cystoscope was advanced through the urethra and into the bladder. The bladder was drained and then fully distended. It was evaluated in a systematic fashion to visualize the entire surface of the bladder. This was negative for tumors.  Attention was turned the left ureter orifice. It was cannulated with a 5 [place] ureter catheter. I injected 10 cc of Omnipaque to obtain a left retrograde pyelogram. This showed a filling defect in the left proximal ureter consistent with a stone. Mild hydronephrosis  proximal to the ureter stone. No extravasation.  2 sensor wires were placed up the left ureter orifice and into the left renal pelvis on fluoroscopy. Lumbar was secured as a safety wire while that was used as a working wire.  Semirigid ureteroscope was placed through the urethra, through the bladder, and into the left ureter. This was navigated easily up the ureter, but it was too short to reach the proximal stone. This was withdrawn.  The ureter access sheath which was 12-14 [place] was placed over the working wire with ease under fluoroscopy. The obturator and working wire were removed. A flexible digital ureter scope was placed through the sheath and into the proximal ureter. The stone was immediately visible. Backstop gel was placed proximal to the stone under fluoroscopy.  Lithotripsy was carried out with a 200  holmium laser filament at settings of 0.5 J and 20 Hz. The stone was then broken up  and the pieces were removed with a basket. I then navigated into the kidney where there was a large amount of stone dust and old blood clot which made visualization poor. I searched the calyces in a systematic fashion, but did not find any large stone fragments. I then withdrew the access sheath and ureter scope. The entire length of the ureter was visualized. There was some edema where the stone had been located. It was noted that the stone was somewhat adherent to the mucosa of the ureter. I therefore elected to leave a stent.  Ureter scope was removed. Cystoscope was loaded over the safety wire and a 6 x 26 Polaris stent without tether was placed up the wire with ease. A good curl deployed in the left renal pelvis and the loops were deployed in the  bladder. The bladder was drained. I placed 10 cc of lidocaine jelly into the urethra.  This completed the procedure. Anesthesia was reversed, he was placed in a supine position, and he was taken to the PACU in stable condition.  All counts were correct at the  end of the case.  He'll be given Keflex to cover for the instrumentation.

## 2014-01-31 ENCOUNTER — Encounter (HOSPITAL_BASED_OUTPATIENT_CLINIC_OR_DEPARTMENT_OTHER): Payer: Self-pay | Admitting: Urology

## 2014-02-02 NOTE — Progress Notes (Signed)
Quick Note:  Please inform the patient that small bowel biopsies were normal and to continue current plan of action. Needs followup office visit ______

## 2014-02-05 ENCOUNTER — Ambulatory Visit (HOSPITAL_COMMUNITY): Admission: RE | Admit: 2014-02-05 | Payer: Medicare Other | Source: Ambulatory Visit

## 2014-02-06 NOTE — Addendum Note (Signed)
Addendum created 02/06/14 1023 by Myrtie Soman, MD   Modules edited: Anesthesia Attestations

## 2014-02-26 ENCOUNTER — Ambulatory Visit: Payer: Medicare Other | Admitting: Gastroenterology

## 2014-03-19 ENCOUNTER — Ambulatory Visit: Payer: Medicare Other | Admitting: Gastroenterology

## 2014-03-23 ENCOUNTER — Other Ambulatory Visit (INDEPENDENT_AMBULATORY_CARE_PROVIDER_SITE_OTHER): Payer: Medicare Other

## 2014-03-23 ENCOUNTER — Ambulatory Visit (INDEPENDENT_AMBULATORY_CARE_PROVIDER_SITE_OTHER): Payer: Medicare Other | Admitting: Gastroenterology

## 2014-03-23 ENCOUNTER — Encounter: Payer: Self-pay | Admitting: Gastroenterology

## 2014-03-23 VITALS — BP 120/62 | HR 68 | Ht 70.0 in | Wt 140.0 lb

## 2014-03-23 DIAGNOSIS — R634 Abnormal weight loss: Secondary | ICD-10-CM

## 2014-03-23 DIAGNOSIS — R9389 Abnormal findings on diagnostic imaging of other specified body structures: Secondary | ICD-10-CM

## 2014-03-23 DIAGNOSIS — R197 Diarrhea, unspecified: Secondary | ICD-10-CM

## 2014-03-23 LAB — CBC WITH DIFFERENTIAL/PLATELET
BASOS ABS: 0 10*3/uL (ref 0.0–0.1)
BASOS PCT: 0.3 % (ref 0.0–3.0)
Eosinophils Absolute: 0.2 10*3/uL (ref 0.0–0.7)
Eosinophils Relative: 3.9 % (ref 0.0–5.0)
HCT: 41.3 % (ref 39.0–52.0)
HEMOGLOBIN: 13.5 g/dL (ref 13.0–17.0)
LYMPHS PCT: 20.1 % (ref 12.0–46.0)
Lymphs Abs: 0.9 10*3/uL (ref 0.7–4.0)
MCHC: 32.7 g/dL (ref 30.0–36.0)
MCV: 86.3 fl (ref 78.0–100.0)
Monocytes Absolute: 0.5 10*3/uL (ref 0.1–1.0)
Monocytes Relative: 10.3 % (ref 3.0–12.0)
Neutro Abs: 2.9 10*3/uL (ref 1.4–7.7)
Neutrophils Relative %: 65.4 % (ref 43.0–77.0)
Platelets: 223 10*3/uL (ref 150.0–400.0)
RBC: 4.78 Mil/uL (ref 4.22–5.81)
RDW: 14.9 % (ref 11.5–15.5)
WBC: 4.5 10*3/uL (ref 4.0–10.5)

## 2014-03-23 LAB — COMPREHENSIVE METABOLIC PANEL
ALT: 23 U/L (ref 0–53)
AST: 28 U/L (ref 0–37)
Albumin: 3.9 g/dL (ref 3.5–5.2)
Alkaline Phosphatase: 97 U/L (ref 39–117)
BUN: 13 mg/dL (ref 6–23)
CALCIUM: 9.8 mg/dL (ref 8.4–10.5)
CHLORIDE: 105 meq/L (ref 96–112)
CO2: 30 meq/L (ref 19–32)
CREATININE: 1.3 mg/dL (ref 0.4–1.5)
GFR: 71.79 mL/min (ref >60.00)
GLUCOSE: 115 mg/dL — AB (ref 70–99)
Potassium: 4.5 mEq/L (ref 3.5–5.1)
Sodium: 141 mEq/L (ref 135–145)
Total Bilirubin: 0.8 mg/dL (ref 0.2–1.2)
Total Protein: 6.8 g/dL (ref 6.0–8.3)

## 2014-03-23 LAB — AMYLASE: Amylase: 71 U/L (ref 27–131)

## 2014-03-23 NOTE — Assessment & Plan Note (Signed)
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  In March, 2015 liver tests were normal.  MRCP was ordered but was not done.  Recommendations #1 repeat LFTs and check CA 19-9 #2 MRCP

## 2014-03-23 NOTE — Assessment & Plan Note (Signed)
Etiology is uncertain.  He had a robust response to 2 Imodium and is afraid to take this medication again.  I suggested that he try just one tab and not try to move his bowels until he has to do so spontaneously.

## 2014-03-23 NOTE — Patient Instructions (Signed)
You have been scheduled for an MRI at [facility] on 04/04/2014. Your appointment time is 9am. Please arrive 15 minutes prior to your appointment time for registration purposes. There is no prep for this test. However, if you have any metal in your body, have a pacemaker or defibrillator, please be sure to let your ordering physician know. This test typically takes 45 minutes to 1 hour to complete.  Go to the basement for labs today

## 2014-03-23 NOTE — Progress Notes (Signed)
          History of Present Illness:  The patient continues to complain of frequent loose stools, postprandial abdominal distention with early satiety, and weight loss.  Lab work in March, 2015 was unremarkable.  CT scan, which I reviewed, demonstrated bile duct dilatation to 10 mm, a prominent pancreatic duct measuring 4-5 mm, and a contracted gallbladder with wall thickening and stones.  Since his last visit he's lost 6 more pounds.  He claimed that he  strained to move his bowels after taking 2 Imodium tablets, once.  Without medication he has several loose stools daily.    Review of Systems: Pertinent positive and negative review of systems were noted in the above HPI section. All other review of systems were otherwise negative.    Current Medications, Allergies, Past Medical History, Past Surgical History, Family History and Social History were reviewed in [place] record  Vital signs were reviewed in today's medical record. Physical Exam: General: Thin male in no acute distress Skin: anicteric Head: Normocephalic and atraumatic Eyes:  sclerae anicteric, EOMI Ears: Normal auditory acuity Mouth: No deformity or lesions Lungs: Clear throughout to auscultation Heart: Regular rate and rhythm; no murmurs, rubs or bruits Abdomen: Soft, non tender and non distended. No masses, hepatosplenomegaly or hernias noted. Normal Bowel sounds.  There is a palpable subcutaneous nontender mass in the right periumbilical area consistent with a lipoma Rectal:deferred Musculoskeletal: Symmetrical with no gross deformities  Pulses:  Normal pulses noted Extremities: No clubbing, cyanosis, edema or deformities noted Neurological: Alert oriented x 4, grossly nonfocal Psychological:  Alert and cooperative. Normal mood and affect  See Assessment and Plan under Problem List

## 2014-03-24 LAB — CANCER ANTIGEN 19-9: CA 19-9: 5.4 U/mL (ref <35.0)

## 2014-03-27 NOTE — Progress Notes (Signed)
Quick Note:  Please inform the patient that lab work was normal and to continue current plan of action ______ 

## 2014-04-04 ENCOUNTER — Other Ambulatory Visit: Payer: Self-pay | Admitting: Gastroenterology

## 2014-04-04 ENCOUNTER — Ambulatory Visit (HOSPITAL_COMMUNITY)
Admission: RE | Admit: 2014-04-04 | Discharge: 2014-04-04 | Disposition: A | Discharge status: Home / Self Care | Payer: Medicare Other | Source: Ambulatory Visit | Attending: Gastroenterology | Admitting: Gastroenterology

## 2014-04-04 DIAGNOSIS — R109 Unspecified abdominal pain: Secondary | ICD-10-CM | POA: Insufficient documentation

## 2014-04-04 DIAGNOSIS — K8020 Calculus of gallbladder without cholecystitis without obstruction: Secondary | ICD-10-CM

## 2014-04-04 DIAGNOSIS — K8689 Other specified diseases of pancreas: Secondary | ICD-10-CM | POA: Insufficient documentation

## 2014-04-04 DIAGNOSIS — N134 Hydroureter: Secondary | ICD-10-CM | POA: Insufficient documentation

## 2014-04-04 DIAGNOSIS — R188 Other ascites: Secondary | ICD-10-CM | POA: Insufficient documentation

## 2014-04-04 DIAGNOSIS — R599 Enlarged lymph nodes, unspecified: Secondary | ICD-10-CM | POA: Insufficient documentation

## 2014-04-04 DIAGNOSIS — K838 Other specified diseases of biliary tract: Secondary | ICD-10-CM | POA: Insufficient documentation

## 2014-04-04 DIAGNOSIS — R110 Nausea: Secondary | ICD-10-CM | POA: Insufficient documentation

## 2014-04-04 DIAGNOSIS — K219 Gastro-esophageal reflux disease without esophagitis: Secondary | ICD-10-CM | POA: Insufficient documentation

## 2014-04-04 MED ORDER — GADOBENATE DIMEGLUMINE 529 MG/ML IV SOLN
13.0000 mL | Freq: Once | INTRAVENOUS | Status: AC | PRN
  Administered 2014-04-04: 13 mL via INTRAVENOUS

## 2014-04-06 ENCOUNTER — Other Ambulatory Visit: Payer: Self-pay

## 2014-04-06 DIAGNOSIS — Z125 Encounter for screening for malignant neoplasm of prostate: Secondary | ICD-10-CM

## 2014-04-06 DIAGNOSIS — C61 Malignant neoplasm of prostate: Secondary | ICD-10-CM

## 2014-04-06 DIAGNOSIS — R599 Enlarged lymph nodes, unspecified: Secondary | ICD-10-CM

## 2014-04-06 DIAGNOSIS — K6389 Other specified diseases of intestine: Secondary | ICD-10-CM

## 2014-04-06 DIAGNOSIS — Z1211 Encounter for screening for malignant neoplasm of colon: Secondary | ICD-10-CM

## 2014-04-06 DIAGNOSIS — C189 Malignant neoplasm of colon, unspecified: Secondary | ICD-10-CM

## 2014-04-10 ENCOUNTER — Other Ambulatory Visit (INDEPENDENT_AMBULATORY_CARE_PROVIDER_SITE_OTHER): Payer: Medicare Other

## 2014-04-10 DIAGNOSIS — K6389 Other specified diseases of intestine: Secondary | ICD-10-CM

## 2014-04-10 DIAGNOSIS — Z1211 Encounter for screening for malignant neoplasm of colon: Secondary | ICD-10-CM

## 2014-04-10 DIAGNOSIS — C61 Malignant neoplasm of prostate: Secondary | ICD-10-CM

## 2014-04-10 DIAGNOSIS — R599 Enlarged lymph nodes, unspecified: Secondary | ICD-10-CM

## 2014-04-10 DIAGNOSIS — C189 Malignant neoplasm of colon, unspecified: Secondary | ICD-10-CM

## 2014-04-10 DIAGNOSIS — Z125 Encounter for screening for malignant neoplasm of prostate: Secondary | ICD-10-CM

## 2014-04-10 LAB — PSA: PSA: 5.9 ng/mL — ABNORMAL HIGH (ref 0.10–4.00)

## 2014-04-11 LAB — CEA: CEA: 2.9 ng/mL (ref 0.0–5.0)

## 2014-04-19 ENCOUNTER — Telehealth: Payer: Self-pay | Admitting: Gastroenterology

## 2014-04-19 NOTE — Telephone Encounter (Signed)
Pt c/o abdominal pain/swelling. States that every time he eats his stomach swells. States he has not been eating that much because of this and he is losing a lot of wt. Requested an appt with Dr. Deatra Ina tomorrow. Discussed with pt there were no available appts tomorrow, pt scheduled 04/23/14@2 :30pm to see Nicoletta Ba PA. Pt aware of appt.

## 2014-04-20 ENCOUNTER — Other Ambulatory Visit: Payer: Self-pay | Admitting: Gastroenterology

## 2014-04-20 ENCOUNTER — Encounter (HOSPITAL_COMMUNITY)
Admission: RE | Admit: 2014-04-20 | Discharge: 2014-04-20 | Disposition: A | Discharge status: Home / Self Care | Payer: Medicare Other | Source: Ambulatory Visit | Attending: Gastroenterology | Admitting: Gastroenterology

## 2014-04-20 ENCOUNTER — Telehealth: Payer: Self-pay | Admitting: Gastroenterology

## 2014-04-20 DIAGNOSIS — C7952 Secondary malignant neoplasm of bone marrow: Principal | ICD-10-CM

## 2014-04-20 DIAGNOSIS — R599 Enlarged lymph nodes, unspecified: Secondary | ICD-10-CM | POA: Diagnosis present

## 2014-04-20 DIAGNOSIS — R911 Solitary pulmonary nodule: Secondary | ICD-10-CM | POA: Insufficient documentation

## 2014-04-20 DIAGNOSIS — C7951 Secondary malignant neoplasm of bone: Secondary | ICD-10-CM | POA: Diagnosis not present

## 2014-04-20 LAB — GLUCOSE, CAPILLARY: Glucose-Capillary: 124 mg/dL — ABNORMAL HIGH (ref 70–99)

## 2014-04-20 MED ORDER — FLUDEOXYGLUCOSE F - 18 (FDG) INJECTION
8.5000 | Freq: Once | INTRAVENOUS | Status: AC | PRN
  Administered 2014-04-20: 8.5 via INTRAVENOUS

## 2014-04-20 NOTE — Telephone Encounter (Signed)
Briefly discussed the PET scan and MRCP/MRI findings.  Case discussed with Dr. Oretha Caprice.  We will proceed with EUS.  The patient is amenable to this.

## 2014-04-23 ENCOUNTER — Encounter: Payer: Self-pay | Admitting: Physician Assistant

## 2014-04-23 ENCOUNTER — Ambulatory Visit (INDEPENDENT_AMBULATORY_CARE_PROVIDER_SITE_OTHER): Payer: Medicare Other | Admitting: Physician Assistant

## 2014-04-23 ENCOUNTER — Telehealth: Payer: Self-pay | Admitting: *Deleted

## 2014-04-23 VITALS — BP 114/62 | HR 64 | Ht 71.0 in | Wt 128.0 lb

## 2014-04-23 DIAGNOSIS — M533 Sacrococcygeal disorders, not elsewhere classified: Secondary | ICD-10-CM

## 2014-04-23 DIAGNOSIS — R634 Abnormal weight loss: Secondary | ICD-10-CM

## 2014-04-23 DIAGNOSIS — R630 Anorexia: Secondary | ICD-10-CM

## 2014-04-23 DIAGNOSIS — M545 Low back pain, unspecified: Secondary | ICD-10-CM

## 2014-04-23 DIAGNOSIS — C801 Malignant (primary) neoplasm, unspecified: Secondary | ICD-10-CM

## 2014-04-23 DIAGNOSIS — D126 Benign neoplasm of colon, unspecified: Secondary | ICD-10-CM | POA: Insufficient documentation

## 2014-04-23 DIAGNOSIS — R109 Unspecified abdominal pain: Secondary | ICD-10-CM

## 2014-04-23 DIAGNOSIS — C799 Secondary malignant neoplasm of unspecified site: Secondary | ICD-10-CM

## 2014-04-23 MED ORDER — HYDROCODONE-ACETAMINOPHEN 5-325 MG PO TABS: ORAL_TABLET | ORAL | Status: DC

## 2014-04-23 NOTE — Telephone Encounter (Signed)
I called and Left a message for [name] to please call us. We are trying to get an appointment asap with Dr. Benay Spice. This patient has Metastataic cancer, unknown primary.

## 2014-04-23 NOTE — Telephone Encounter (Signed)
I called the patient and left a message that I am sorry but forgot to tell him to go to our lab.  I asked if they could come to our lab possibly tomorrow Tues. I also told them that we have  Called the new patient coordinator and left a message , we are trying to get him an appointment asap.

## 2014-04-23 NOTE — Progress Notes (Signed)
Subjective:    Patient ID: Daniel Huynh, male    DOB: Mar 23, 1941, 73 y.o.   MRN: [id number]  HPI  Cristina is a pleasant 73 year old African American male known to Dr. Deatra Ina who has been undergoing evaluation for diarrhea and weight loss. He has also developed anorexia and lower back pain. He has lost about 25 pounds over the past 4 months. Patient comes in today to discuss results of recent MRCP and PET scan. Colonoscopy had been done in September of 2014 with one sessile polyp removed in the sigmoid colon this was adenomatous. EGD was done in March of 2015 with Billroth I anatomy and nonerosive gastritis. CT of the abdomen and pelvis 01/25/2014 showed mild periportal edema mildly prominent pancreatic act measuring 4-5 mm, dilated common bile duct of 10 mm and some gallbladder wall thickening he also had moderate left hydronephrosis with an obstructing 10 mm left UVJ calculus. MRCP was then ordered however this was not done until 04/04/2014 and showed findings suspicious for metastatic disease including new rectal abdominal adenopathy, retroperitoneal adenopathy and multiple foci of abnormal T2 hyperintensity in hyperenhancing and throughout the thoracic lumbar spine. Areas common duct and pancreatic duct dilation without obstruction in the common duct is noted to be tortuous. PET scan has now been completed and shows no hypermetabolic mediastinal lymph nodes 13 mm pulmonary nodule, there are 2 hypermetabolic lymph nodes within the porta hepatis no discrete hypermetabolic abnormal activity within the pancreas, liver appears normal there is ascites in the upper abdomen. There is widespread skeletal metastases multiple sites of abnormal activity within the pelvis spine proximal femurs and humeri. Metastatic lesions involved nearly all the vertebral bodies. CA 19-9 is normal at 5.4, CEA normal at 2.9, and PSA mildly elevated at 5.9, cemented on 03/23/2014 was within normal limits. Dr. Deatra Ina had  discussed MRCP , and PET scan findings with patient per phone.  Review of Systems  Constitutional: Positive for appetite change, fatigue and unexpected weight change.  HENT: Negative.   Eyes: Negative.   Respiratory: Negative.   Cardiovascular: Negative.   Gastrointestinal: Positive for abdominal pain and diarrhea.  Endocrine: Negative.   Genitourinary: Negative.   Musculoskeletal: Positive for back pain.  Allergic/Immunologic: Negative.   Neurological: Negative.   Hematological: Negative.   Psychiatric/Behavioral: Negative.    Outpatient Prescriptions Prior to Visit  Medication Sig Dispense Refill  . buPROPion (WELLBUTRIN XL) 150 MG 24 hr tablet Take 150 mg by mouth daily.      . cephALEXin (KEFLEX) 500 MG capsule Take 1 capsule (500 mg total) by mouth 3 (three) times daily.  9 capsule  0  . dicyclomine (BENTYL) 10 MG capsule Take 1 tab twice daily as needed for spasms and cramping, diarrhea.  60 capsule  1  . doxazosin (CARDURA) 8 MG tablet Take 4 mg by mouth at bedtime.        Marland Kitchen doxepin (SINEQUAN) 50 MG capsule Take 50 mg by mouth at bedtime.        . famotidine (PEPCID) 20 MG tablet Take 20 mg by mouth 2 (two) times daily.      Marland Kitchen gabapentin (NEURONTIN) 100 MG capsule Take 100 mg by mouth 5 (five) times daily. 5 times daily for nerve pain      . loperamide (ANTI-DIARRHEAL) 2 MG tablet Take 2 mg by mouth 4 (four) times daily as needed for diarrhea or loose stools.      Marland Kitchen oxybutynin (DITROPAN) 5 MG tablet Take 1 tablet (5 mg total)  by mouth every 6 (six) hours as needed for bladder spasms.  40 tablet  4  . phenazopyridine (PYRIDIUM) 100 MG tablet Take 1 tablet (100 mg total) by mouth every 8 (eight) hours as needed for pain (Burning urination.  Will turn urine and body fluids orange.).  30 tablet  3  . rifaximin (XIFAXAN) 550 MG TABS tablet Take 550 mg by mouth 2 (two) times daily.      . sildenafil (VIAGRA) 100 MG tablet Take 50 mg by mouth daily as needed for erectile dysfunction.        . tamsulosin (FLOMAX) 0.4 MG CAPS capsule Take 1 capsule (0.4 mg total) by mouth daily.  30 capsule  0  . Testosterone 20.25 MG/ACT (1.62%) GEL Place 2 application onto the skin daily. Pt uses 2 pumps      . zolpidem (AMBIEN) 10 MG tablet Take 5 mg by mouth at bedtime as needed. sleep      . HYDROcodone-acetaminophen (NORCO/VICODIN) 5-325 MG per tablet Take 1 tablet by mouth every 4 (four) hours as needed for moderate pain.  10 tablet  0   No facility-administered medications prior to visit.   Allergies  Allergen Reactions  . Tylenol With Codeine #3 [Acetaminophen-Codeine] Rash       Patient Active Problem List   Diagnosis Date Noted  . Loss of weight 03/23/2014  . 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 Diarrhea 08/03/2013  . 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 Cough 06/18/2013  . Other malaise and fatigue 06/15/2013  . Personal history of colonic polyps 08/10/2012  . IBS (irritable bowel syndrome) 08/10/2012  . Diverticulitis with perforation 07/14/2012  . Anxiety   . Depression    History  Substance Use Topics  . Smoking status: Former Smoker -- 0.25 packs/day for 10 years    Types: Cigarettes    Quit date: 11/16/1970  . Smokeless tobacco: Never Used  . Alcohol Use: 0.6 oz/week    1 Glasses of wine per week   family history includes Cancer in his sister; Heart disease in his mother.  Objective:   Physical Exam  well-developed very African American male in no acute distress accompanied by his wife blood pressure 114/62 pulse 64 height 5 foot 11, Weight 128. Weight was 146 in mid March 2015. HEENT; nontraumatic normocephalic EOMI PERRLA sclera anicteric, Supple ;no JVD no adenopathy palpable, Cardiovascular; regular rate and rhythm with S1-S2 no murmur or gallop, Pulm; clear bilaterally, Abdomen ;soft flat bowel sounds are active there is no palpable mass or hepatosplenomegaly he has mild tenderness across bilateral lower quadrants no guarding or rebound, Rectal ;exam not done,  Extremities; no clubbing cyanosis or edema skin warm and dry, Psych; mood and affect appropriate        Assessment & Plan:  #57  73 year old [place] American male with a several month history of weight loss, lack of appetite, mild diarrhea and now with lower abdominal pain and low back pain Unfortunately PET scan is consistent with widespread skeletal metastases, there is porta hepatis adenopathy but no abnormal metabolic activity  with in the pancreas. Tumor markers negative with exception of PSA slightly elevated. Patient has a metastatic cancer of unknown primary, it is possible that he may have a pancreatic or ampullary adenocarcinoma versus occult prostate adenocarcinoma.  #2 status post remote partial gastrectomy Billroth I #2 history of adenomatous colon polyps  Plan; Patient will be scheduled for endoscopic ultrasound and possible biopsy with Dr. Ardis Hughs. Procedure was discussed in detail with the patient  and he is agreeable to proceed. This will be done on Thursday, June 18. Repeat CBC/CMET today Oncology referral to Dr. Julieanne Manson Patient is having a lot of back pain-we'll start hydrocodone 5/325 one every 4-6 hours as needed for pain. He is encouraged to eat for 5 small meals per day and add a protein supplements or protein shake twice daily between meals

## 2014-04-23 NOTE — Patient Instructions (Addendum)
Please go to the basement level to have your labs drawn.  Dr. Ardis Hughs Medical Assistant Patty, will be calling you with the appointment for the Endoscopy Ultrasound. She is in tomorrow.  Dr Ardis Hughs said it will be next Thursday the 18th.  Chong Sicilian will call you with the time.  Location will be [facility], 1st floor.   We have given you a printed prescription for the pain medication to take to your pharmacy.  We will call you with the appointment with Dr. Benay Spice. We are calling them today to schedule it.

## 2014-04-24 ENCOUNTER — Telehealth: Payer: Self-pay | Admitting: Oncology

## 2014-04-24 ENCOUNTER — Other Ambulatory Visit: Payer: Self-pay

## 2014-04-24 ENCOUNTER — Other Ambulatory Visit (INDEPENDENT_AMBULATORY_CARE_PROVIDER_SITE_OTHER): Payer: Medicare Other

## 2014-04-24 DIAGNOSIS — C801 Malignant (primary) neoplasm, unspecified: Secondary | ICD-10-CM

## 2014-04-24 DIAGNOSIS — C799 Secondary malignant neoplasm of unspecified site: Secondary | ICD-10-CM

## 2014-04-24 LAB — CBC WITH DIFFERENTIAL/PLATELET
BASOS ABS: 0 10*3/uL (ref 0.0–0.1)
BASOS PCT: 0.4 % (ref 0.0–3.0)
Eosinophils Absolute: 0.3 10*3/uL (ref 0.0–0.7)
Eosinophils Relative: 4.5 % (ref 0.0–5.0)
HCT: 41.7 % (ref 39.0–52.0)
Hemoglobin: 13.6 g/dL (ref 13.0–17.0)
LYMPHS PCT: 13.2 % (ref 12.0–46.0)
Lymphs Abs: 0.9 10*3/uL (ref 0.7–4.0)
MCHC: 32.6 g/dL (ref 30.0–36.0)
MCV: 85.6 fl (ref 78.0–100.0)
Monocytes Absolute: 0.6 10*3/uL (ref 0.1–1.0)
Monocytes Relative: 9.7 % (ref 3.0–12.0)
NEUTROS PCT: 72.2 % (ref 43.0–77.0)
Neutro Abs: 4.8 10*3/uL (ref 1.4–7.7)
Platelets: 193 10*3/uL (ref 150.0–400.0)
RBC: 4.87 Mil/uL (ref 4.22–5.81)
RDW: 13.6 % (ref 11.5–15.5)
WBC: 6.7 10*3/uL (ref 4.0–10.5)

## 2014-04-24 LAB — COMPREHENSIVE METABOLIC PANEL
ALBUMIN: 3.9 g/dL (ref 3.5–5.2)
ALT: 16 U/L (ref 0–53)
AST: 31 U/L (ref 0–37)
Alkaline Phosphatase: 216 U/L — ABNORMAL HIGH (ref 39–117)
BUN: 18 mg/dL (ref 6–23)
CALCIUM: 12.6 mg/dL — AB (ref 8.4–10.5)
CHLORIDE: 99 meq/L (ref 96–112)
CO2: 33 mEq/L — ABNORMAL HIGH (ref 19–32)
Creatinine, Ser: 1.6 mg/dL — ABNORMAL HIGH (ref 0.4–1.5)
GFR: 53.44 mL/min — ABNORMAL LOW (ref >60.00)
Glucose, Bld: 117 mg/dL — ABNORMAL HIGH (ref 70–99)
POTASSIUM: 4.8 meq/L (ref 3.5–5.1)
Sodium: 139 mEq/L (ref 135–145)
Total Bilirubin: 0.7 mg/dL (ref 0.2–1.2)
Total Protein: 7.1 g/dL (ref 6.0–8.3)

## 2014-04-24 NOTE — Telephone Encounter (Signed)
S/W PATIENT MOTHER(MARY) AND NP APPT FOR 06/16 @ 10:30 W/DR. SHADAD.  REFERRING DR. Deatra Ina,  DX-METS [place] WELCOME PACKET MAILED.

## 2014-04-24 NOTE — Telephone Encounter (Signed)
I called and spoke to Daniel Huynh, the new patient coordinator at [facility].  She said they made this patient an appointment with Dr. Alen Blew for 05-01-2014 at 10:30 am.[name] said she just spoke to the patient's wife about the appointment. Patient advised.

## 2014-04-24 NOTE — Telephone Encounter (Signed)
LEFT MESSAGE FOR PATIENT TO RETURN CALL TO SCHEDULE NP APPT.  °

## 2014-04-24 NOTE — Progress Notes (Signed)
Reviewed and agree with management. Robert D. Kaplan, M.D., FACG  

## 2014-04-24 NOTE — Telephone Encounter (Signed)
C/D 04/24/14 for appt. 05/01/14

## 2014-04-25 ENCOUNTER — Other Ambulatory Visit: Payer: Self-pay

## 2014-04-25 DIAGNOSIS — K831 Obstruction of bile duct: Secondary | ICD-10-CM

## 2014-04-25 DIAGNOSIS — R933 Abnormal findings on diagnostic imaging of other parts of digestive tract: Secondary | ICD-10-CM

## 2014-04-26 ENCOUNTER — Emergency Department (HOSPITAL_COMMUNITY): Payer: Medicare Other

## 2014-04-26 ENCOUNTER — Observation Stay (HOSPITAL_COMMUNITY)
Admission: EM | Admit: 2014-04-26 | Discharge: 2014-04-27 | Disposition: A | Discharge status: Home / Self Care | Payer: Medicare Other | Attending: Internal Medicine | Admitting: Internal Medicine

## 2014-04-26 ENCOUNTER — Emergency Department (INDEPENDENT_AMBULATORY_CARE_PROVIDER_SITE_OTHER)
Admission: EM | Admit: 2014-04-26 | Discharge: 2014-04-26 | Disposition: A | Discharge status: Nursing Home (Includes ICF) | Payer: Medicare Other | Source: Home / Self Care

## 2014-04-26 ENCOUNTER — Encounter (HOSPITAL_COMMUNITY): Payer: Self-pay | Admitting: Emergency Medicine

## 2014-04-26 DIAGNOSIS — E43 Unspecified severe protein-calorie malnutrition: Secondary | ICD-10-CM | POA: Diagnosis present

## 2014-04-26 DIAGNOSIS — R0789 Other chest pain: Secondary | ICD-10-CM | POA: Diagnosis present

## 2014-04-26 DIAGNOSIS — Z792 Long term (current) use of antibiotics: Secondary | ICD-10-CM | POA: Insufficient documentation

## 2014-04-26 DIAGNOSIS — R079 Chest pain, unspecified: Secondary | ICD-10-CM

## 2014-04-26 DIAGNOSIS — J45901 Unspecified asthma with (acute) exacerbation: Secondary | ICD-10-CM | POA: Insufficient documentation

## 2014-04-26 DIAGNOSIS — R634 Abnormal weight loss: Secondary | ICD-10-CM | POA: Insufficient documentation

## 2014-04-26 DIAGNOSIS — Z885 Allergy status to narcotic agent status: Secondary | ICD-10-CM | POA: Insufficient documentation

## 2014-04-26 DIAGNOSIS — Z886 Allergy status to analgesic agent status: Secondary | ICD-10-CM | POA: Insufficient documentation

## 2014-04-26 DIAGNOSIS — C801 Malignant (primary) neoplasm, unspecified: Secondary | ICD-10-CM | POA: Insufficient documentation

## 2014-04-26 DIAGNOSIS — F411 Generalized anxiety disorder: Secondary | ICD-10-CM | POA: Insufficient documentation

## 2014-04-26 DIAGNOSIS — Z87891 Personal history of nicotine dependence: Secondary | ICD-10-CM | POA: Insufficient documentation

## 2014-04-26 DIAGNOSIS — Z87448 Personal history of other diseases of urinary system: Secondary | ICD-10-CM | POA: Insufficient documentation

## 2014-04-26 DIAGNOSIS — Z8739 Personal history of other diseases of the musculoskeletal system and connective tissue: Secondary | ICD-10-CM | POA: Insufficient documentation

## 2014-04-26 DIAGNOSIS — K219 Gastro-esophageal reflux disease without esophagitis: Secondary | ICD-10-CM | POA: Insufficient documentation

## 2014-04-26 DIAGNOSIS — Z79899 Other long term (current) drug therapy: Secondary | ICD-10-CM | POA: Insufficient documentation

## 2014-04-26 DIAGNOSIS — R197 Diarrhea, unspecified: Secondary | ICD-10-CM | POA: Diagnosis present

## 2014-04-26 DIAGNOSIS — F329 Major depressive disorder, single episode, unspecified: Secondary | ICD-10-CM | POA: Insufficient documentation

## 2014-04-26 DIAGNOSIS — Z8719 Personal history of other diseases of the digestive system: Secondary | ICD-10-CM | POA: Insufficient documentation

## 2014-04-26 DIAGNOSIS — F3289 Other specified depressive episodes: Secondary | ICD-10-CM | POA: Insufficient documentation

## 2014-04-26 DIAGNOSIS — C7951 Secondary malignant neoplasm of bone: Principal | ICD-10-CM | POA: Diagnosis present

## 2014-04-26 DIAGNOSIS — I3139 Other pericardial effusion (noninflammatory): Secondary | ICD-10-CM

## 2014-04-26 DIAGNOSIS — J90 Pleural effusion, not elsewhere classified: Secondary | ICD-10-CM | POA: Insufficient documentation

## 2014-04-26 DIAGNOSIS — C7952 Secondary malignant neoplasm of bone marrow: Principal | ICD-10-CM

## 2014-04-26 DIAGNOSIS — I319 Disease of pericardium, unspecified: Secondary | ICD-10-CM | POA: Insufficient documentation

## 2014-04-26 DIAGNOSIS — I313 Pericardial effusion (noninflammatory): Secondary | ICD-10-CM

## 2014-04-26 DIAGNOSIS — R0602 Shortness of breath: Secondary | ICD-10-CM

## 2014-04-26 HISTORY — DX: Secondary malignant neoplasm of unspecified site: C79.9

## 2014-04-26 LAB — COMPREHENSIVE METABOLIC PANEL
ALBUMIN: 3.7 g/dL (ref 3.5–5.2)
ALT: 18 U/L (ref 0–53)
AST: 36 U/L (ref 0–37)
Alkaline Phosphatase: 290 U/L — ABNORMAL HIGH (ref 39–117)
BILIRUBIN TOTAL: 0.5 mg/dL (ref 0.3–1.2)
BUN: 19 mg/dL (ref 6–23)
CO2: 31 mEq/L (ref 19–32)
CREATININE: 1.56 mg/dL — AB (ref 0.50–1.35)
Calcium: 12.7 mg/dL — ABNORMAL HIGH (ref 8.4–10.5)
Chloride: 96 mEq/L (ref 96–112)
GFR calc Af Amer: 50 mL/min — ABNORMAL LOW (ref >90)
GFR calc non Af Amer: 43 mL/min — ABNORMAL LOW (ref >90)
GLUCOSE: 123 mg/dL — AB (ref 70–99)
POTASSIUM: 4.4 meq/L (ref 3.7–5.3)
Sodium: 142 mEq/L (ref 137–147)
TOTAL PROTEIN: 7.2 g/dL (ref 6.0–8.3)

## 2014-04-26 LAB — CBC WITH DIFFERENTIAL/PLATELET
BASOS PCT: 0 % (ref 0–1)
Basophils Absolute: 0 10*3/uL (ref 0.0–0.1)
EOS ABS: 0.3 10*3/uL (ref 0.0–0.7)
EOS PCT: 5 % (ref 0–5)
HEMATOCRIT: 40.6 % (ref 39.0–52.0)
Hemoglobin: 13.2 g/dL (ref 13.0–17.0)
LYMPHS ABS: 1.1 10*3/uL (ref 0.7–4.0)
Lymphocytes Relative: 18 % (ref 12–46)
MCH: 28 pg (ref 26.0–34.0)
MCHC: 32.5 g/dL (ref 30.0–36.0)
MCV: 86.2 fL (ref 78.0–100.0)
MONO ABS: 0.6 10*3/uL (ref 0.1–1.0)
Monocytes Relative: 10 % (ref 3–12)
NEUTROS PCT: 67 % (ref 43–77)
Neutro Abs: 4 10*3/uL (ref 1.7–7.7)
Platelets: 165 10*3/uL (ref 150–400)
RBC: 4.71 MIL/uL (ref 4.22–5.81)
RDW: 13.2 % (ref 11.5–15.5)
WBC: 6 10*3/uL (ref 4.0–10.5)

## 2014-04-26 LAB — CBC
HEMATOCRIT: 39.4 % (ref 39.0–52.0)
Hemoglobin: 12.8 g/dL — ABNORMAL LOW (ref 13.0–17.0)
MCH: 27.6 pg (ref 26.0–34.0)
MCHC: 32.5 g/dL (ref 30.0–36.0)
MCV: 85.1 fL (ref 78.0–100.0)
Platelets: 154 10*3/uL (ref 150–400)
RBC: 4.63 MIL/uL (ref 4.22–5.81)
RDW: 13.2 % (ref 11.5–15.5)
WBC: 6.8 10*3/uL (ref 4.0–10.5)

## 2014-04-26 LAB — I-STAT TROPONIN, ED: TROPONIN I, POC: 0 ng/mL (ref 0.00–0.08)

## 2014-04-26 LAB — CREATININE, SERUM
Creatinine, Ser: 1.55 mg/dL — ABNORMAL HIGH (ref 0.50–1.35)
GFR calc Af Amer: 50 mL/min — ABNORMAL LOW (ref >90)
GFR calc non Af Amer: 43 mL/min — ABNORMAL LOW (ref >90)

## 2014-04-26 LAB — TROPONIN I: Troponin I: <0.3 ng/mL (ref <0.30)

## 2014-04-26 LAB — PRO B NATRIURETIC PEPTIDE: Pro B Natriuretic peptide (BNP): 93.2 pg/mL (ref 0–125)

## 2014-04-26 MED ORDER — ASPIRIN EC 325 MG PO TBEC
325.0000 mg | DELAYED_RELEASE_TABLET | Freq: Every day | ORAL | Status: DC
  Administered 2014-04-26 – 2014-04-27 (×2): 325 mg via ORAL
  Filled 2014-04-26 (×2): qty 1

## 2014-04-26 MED ORDER — SODIUM CHLORIDE 0.9 % IJ SOLN: 3.0000 mL | INTRAMUSCULAR | Status: DC | PRN

## 2014-04-26 MED ORDER — ZOLPIDEM TARTRATE 5 MG PO TABS: 5.0000 mg | ORAL_TABLET | Freq: Every evening | ORAL | Status: DC | PRN

## 2014-04-26 MED ORDER — BOOST / RESOURCE BREEZE PO LIQD
1.0000 | Freq: Three times a day (TID) | ORAL | Status: DC
  Administered 2014-04-26: 1 via ORAL

## 2014-04-26 MED ORDER — SODIUM CHLORIDE 0.9 % IV SOLN
250.0000 mL | INTRAVENOUS | Status: DC | PRN
Start: 2014-04-26 — End: 2014-04-27

## 2014-04-26 MED ORDER — RIFAXIMIN 550 MG PO TABS
550.0000 mg | ORAL_TABLET | Freq: Two times a day (BID) | ORAL | Status: DC
  Administered 2014-04-26 – 2014-04-27 (×2): 550 mg via ORAL
  Filled 2014-04-26 (×3): qty 1

## 2014-04-26 MED ORDER — OXYBUTYNIN CHLORIDE 5 MG PO TABS
5.0000 mg | ORAL_TABLET | Freq: Four times a day (QID) | ORAL | Status: DC | PRN
  Filled 2014-04-26: qty 1

## 2014-04-26 MED ORDER — HYDROCODONE-ACETAMINOPHEN 5-325 MG PO TABS
1.0000 | ORAL_TABLET | ORAL | Status: DC | PRN
  Administered 2014-04-26: 1 via ORAL
  Filled 2014-04-26: qty 1

## 2014-04-26 MED ORDER — ONDANSETRON HCL 4 MG/2ML IJ SOLN: 4.0000 mg | Freq: Four times a day (QID) | INTRAMUSCULAR | Status: DC | PRN

## 2014-04-26 MED ORDER — HEPARIN SODIUM (PORCINE) 5000 UNIT/ML IJ SOLN
5000.0000 [IU] | Freq: Three times a day (TID) | INTRAMUSCULAR | Status: DC
  Administered 2014-04-26 – 2014-04-27 (×3): 5000 [IU] via SUBCUTANEOUS
  Filled 2014-04-26 (×6): qty 1

## 2014-04-26 MED ORDER — GABAPENTIN 100 MG PO CAPS
100.0000 mg | ORAL_CAPSULE | Freq: Every day | ORAL | Status: DC
  Administered 2014-04-27 (×3): 100 mg via ORAL
  Filled 2014-04-26 (×6): qty 1

## 2014-04-26 MED ORDER — IOHEXOL 350 MG/ML SOLN
80.0000 mL | Freq: Once | INTRAVENOUS | Status: AC | PRN
Start: 2014-04-26 — End: 2014-04-26
  Administered 2014-04-26: 80 mL via INTRAVENOUS

## 2014-04-26 MED ORDER — SODIUM CHLORIDE 0.9 % IJ SOLN
3.0000 mL | Freq: Two times a day (BID) | INTRAMUSCULAR | Status: DC
  Administered 2014-04-27: 3 mL via INTRAVENOUS

## 2014-04-26 MED ORDER — ONDANSETRON HCL 4 MG PO TABS: 4.0000 mg | ORAL_TABLET | Freq: Four times a day (QID) | ORAL | Status: DC | PRN

## 2014-04-26 MED ORDER — DICYCLOMINE HCL 10 MG PO CAPS
10.0000 mg | ORAL_CAPSULE | Freq: Two times a day (BID) | ORAL | Status: DC | PRN
  Filled 2014-04-26: qty 1

## 2014-04-26 MED ORDER — MORPHINE SULFATE 2 MG/ML IJ SOLN: 1.0000 mg | INTRAMUSCULAR | Status: DC | PRN

## 2014-04-26 MED ORDER — DOXAZOSIN MESYLATE 4 MG PO TABS
4.0000 mg | ORAL_TABLET | Freq: Every day | ORAL | Status: DC
  Administered 2014-04-26: 4 mg via ORAL
  Filled 2014-04-26 (×2): qty 1

## 2014-04-26 MED ORDER — TAMSULOSIN HCL 0.4 MG PO CAPS
0.4000 mg | ORAL_CAPSULE | Freq: Every day | ORAL | Status: DC
  Administered 2014-04-26 – 2014-04-27 (×2): 0.4 mg via ORAL
  Filled 2014-04-26 (×2): qty 1

## 2014-04-26 MED ORDER — SODIUM CHLORIDE 0.9 % IV SOLN
Freq: Once | INTRAVENOUS | Status: AC
  Administered 2014-04-26: 09:00:00 via INTRAVENOUS

## 2014-04-26 MED ORDER — SODIUM CHLORIDE 0.9 % IJ SOLN
3.0000 mL | Freq: Two times a day (BID) | INTRAMUSCULAR | Status: DC
  Administered 2014-04-26: 3 mL via INTRAVENOUS

## 2014-04-26 MED ORDER — PANTOPRAZOLE SODIUM 40 MG PO TBEC
40.0000 mg | DELAYED_RELEASE_TABLET | Freq: Every day | ORAL | Status: DC
  Administered 2014-04-26 – 2014-04-27 (×2): 40 mg via ORAL
  Filled 2014-04-26 (×2): qty 1

## 2014-04-26 MED ORDER — BUPROPION HCL ER (XL) 150 MG PO TB24
150.0000 mg | ORAL_TABLET | Freq: Every day | ORAL | Status: DC
  Administered 2014-04-26 – 2014-04-27 (×2): 150 mg via ORAL
  Filled 2014-04-26 (×2): qty 1

## 2014-04-26 NOTE — H&P (Signed)
Date: 04/26/2014               Patient Name:  Daniel Huynh MRN: [id number]  DOB: [birth year] Age / Sex: 73 y.o., male   PCP: Colen Darling, MD         Medical Service: Internal Medicine Teaching Service         Attending Physician: Dr. [name], MD    First Contact: Dr. Joni Reining Pager: [phone]  Second Contact: Dr. Randell Loop Pager: [phone]       After Hours (After 5p/  First Contact Pager: [phone]  weekends / holidays): Second Contact Pager: [phone]   Chief Complaint: Chest Pain  History of Present Illness: Daniel Huynh is a 73 yo AA male with PMH of newly diagnosed Metastatic cancer (unknown primary), BPH, Anxiety, and weight loss.  He reports that the pain is located over his left anterior chest, he notes that it is worse with certain movements.  He notes that he first appreciated this specific pain 3 weeks to a month ago after a golf outing, initially he thought this was a muscle strain and would go away.  However last night the pain became worse and was not relieved in the morning so he went to Urgent Care for further evaluation.  He does report the pain is relieved with Norco.  He denies any SOB, nausea, cough, fever, chills.  No recent leg swelling or prolonged travel.   Meds: No current facility-administered medications for this encounter.    Allergies: Allergies as of 04/26/2014 - Review Complete 04/26/2014  Allergen Reaction Noted  . Tylenol with codeine #3 [acetaminophen-codeine] Rash 07/13/2012   Past Medical History  Diagnosis Date  . Depression   . Anxiety   . Unintentional weight loss   . Generalized headaches   . Diarrhea   . Difficulty urinating   . Arthritis   . Diverticulitis of colon   . Intrinsic asthma     mild   . GERD (gastroesophageal reflux disease)    Past Surgical History  Procedure Laterality Date  . Laparoscopic gastrotomy w/ repair of ulcer  1979  . Cystoscopy with retrograde pyelogram, ureteroscopy and stent  placement Left 01/30/2014    Procedure: CYSTOSCOPY WITH RETROGRADE PYELOGRAM, URETEROSCOPY AND STENT PLACEMENT;  Surgeon: Molli Hazard, MD;  Location: [facility];  Service: Urology;  Laterality: Left;  . Holmium laser application Left 8/92/1194    Procedure: HOLMIUM LASER APPLICATION;  Surgeon: Molli Hazard, MD;  Location: [facility];  Service: Urology;  Laterality: Left;   Family History  Problem Relation Age of Onset  . Heart disease Mother   . Cancer Sister     unknown type   History   Social History  . Marital Status: Married    Spouse Name: N/A    Number of Children: N/A  . Years of Education: N/A   Occupational History  . Not on file.   Social History Main Topics  . Smoking status: Former Smoker -- 0.25 packs/day for 10 years    Types: Cigarettes    Quit date: 11/16/1970  . Smokeless tobacco: Never Used  . Alcohol Use: 0.6 oz/week    1 Glasses of wine per week  . Drug Use: No  . Sexual Activity: Not on file   Other Topics Concern  . Not on file   Social History Narrative  . No narrative on file    Review of Systems: Review of Systems  Constitutional: Positive for weight loss and malaise/fatigue. Negative for fever, chills and diaphoresis.  HENT: Negative for congestion and nosebleeds.   Eyes: Negative for blurred vision and double vision.  Respiratory: Negative for cough, hemoptysis, sputum production, shortness of breath and wheezing.   Cardiovascular: Positive for chest pain. Negative for palpitations and leg swelling.  Gastrointestinal: Positive for diarrhea (chronic for many years). Negative for nausea, vomiting, abdominal pain, constipation and blood in stool.  Genitourinary: Negative for dysuria.  Musculoskeletal: Positive for back pain.  Skin: Negative for rash.  Neurological: Negative for dizziness, weakness and headaches.  Psychiatric/Behavioral: Negative for depression and substance abuse.  All other  systems reviewed and are negative.    Physical Exam: Blood pressure 149/79, pulse 68, temperature 98.6 F (37 C), temperature source Oral, resp. rate 14, height 5\' 11"  (1.803 m), weight 128 lb (58.06 kg), SpO2 92.00%. Physical Exam  Nursing note and vitals reviewed. Constitutional: He is oriented to person, place, and time. No distress.  Cachetic   HENT:  Head: Normocephalic and atraumatic.  Eyes: Pupils are equal, round, and reactive to light.  Cardiovascular: Normal rate, regular rhythm and normal heart sounds.   Pulmonary/Chest: Effort normal and breath sounds normal. No respiratory distress. He has no wheezes. He exhibits tenderness (left 2nd rib, 2nd ICS mid clavicular line.).  Abdominal: Soft. Bowel sounds are normal. He exhibits no distension and no mass. There is tenderness (mild RUQ, LUQ). There is no rebound and no guarding.  Musculoskeletal: Normal range of motion. He exhibits tenderness (Tenderness over T5 spinous process, Lumbar parapinals bilaterally.). He exhibits no edema.  Neurological: He is alert and oriented to person, place, and time. No cranial nerve deficit. Coordination normal.  Skin: Skin is warm and dry.  Psychiatric: Affect normal.     Lab results: Basic Metabolic Panel:  Recent Labs  04/24/14 1225 04/26/14 1047  NA 139 142  K 4.8 4.4  CL 99 96  CO2 33* 31  GLUCOSE 117* 123*  BUN 18 19  CREATININE 1.6* 1.56*  CALCIUM 12.6* 12.7*   Liver Function Tests:  Recent Labs  04/24/14 1225 04/26/14 1047  AST 31 36  ALT 16 18  ALKPHOS 216* 290*  BILITOT 0.7 0.5  PROT 7.1 7.2  ALBUMIN 3.9 3.7   No results found for this basename: LIPASE, AMYLASE,  in the last 72 hours No results found for this basename: AMMONIA,  in the last 72 hours CBC:  Recent Labs  04/24/14 1225 04/26/14 1047  WBC 6.7 6.0  NEUTROABS 4.8 4.0  HGB 13.6 13.2  HCT 41.7 40.6  MCV 85.6 86.2  PLT 193.0 165   Cardiac Enzymes: No results found for this basename: CKTOTAL,  CKMB, CKMBINDEX, TROPONINI,  in the last 72 hours BNP:  Recent Labs  04/26/14 1047  PROBNP 93.2   Imaging results:  Dg Chest 2 View  04/26/2014   CLINICAL DATA:  Left-sided chest pain. Shortness of breath. Current history of metastatic disease of as yet unknown primary.  EXAM: CHEST  2 VIEW  COMPARISON:  PET-CT 04/20/2014.  Two-view chest x-ray 06/05/2013.  FINDINGS: Interval development of small bilateral pleural effusions since the PET-CT 6 days ago. Cardiac silhouette normal in size. Thoracic aorta minimally atherosclerotic. Hilar and mediastinal contours otherwise unremarkable. Lungs emphysematous though clear. 3 mm nodule in the inferior right upper lobe identified on PET-CT not visible on the chest x-ray. No pneumothorax. Mild degenerative changes involving the thoracic spine. Lytic lesions involving multiple thoracic vertebrae, better seen on  the PET-CT.  IMPRESSION: 1. Small bilateral pleural effusions, new since the PET-CT 6 days ago. 2. No acute cardiopulmonary disease otherwise. 3. Osseous metastatic disease, better visualized on the PET-CT.   Electronically Signed   By: Evangeline Dakin M.D.   On: 04/26/2014 10:29   Ct Angio Chest Pe W/cm &/or Wo Cm  04/26/2014   CLINICAL DATA:  Metastatic disease unknown primary. Weight loss and chest pain. Rule out pulmonary embolism.  EXAM: CT ANGIOGRAPHY CHEST WITH CONTRAST  TECHNIQUE: Multidetector CT imaging of the chest was performed using the standard protocol during bolus administration of intravenous contrast. Multiplanar CT image reconstructions and MIPs were obtained to evaluate the vascular anatomy.  CONTRAST:  31mL OMNIPAQUE IOHEXOL 350 MG/ML SOLN  COMPARISON:  CT abdomen 01/26/2014.  PET-CT 04/20/2014  FINDINGS: Negative for pulmonary embolism. Negative for aortic aneurysm or dissection. Mild coronary artery calcification. Heart size within normal limits.  3 mm right upper lobe nodule anterior to the major fissure on axial image 55. This is  unchanged from the prior PET-CT and is indeterminate. No additional nodules. Negative for pneumonia or effusion. No mediastinal adenopathy.  Extensive metastatic disease throughout the entire thoracic spine. Mild pathologic fracture of T5. If the patient has acute neurologic symptoms, consider MRI to evaluate the thoracic spinal cord and spinal canal.  Moderate ascites is present around the liver possibly due to malignancy. Small pericardial effusion.  Review of the MIP images confirms the above findings.  IMPRESSION: Negative for pulmonary embolism.  Widespread bony metastatic disease throughout the thoracic spine with a pathologic mild compression fracture of T5.  3 mm right upper lobe nodule, possibly metastatic disease however this could also be benign.  Moderate ascites.  Small pericardial effusion.   Electronically Signed   By: Franchot Gallo M.D.   On: 04/26/2014 12:10    Other results: EKG: Sinus bradycardia (rate 59), normal axis, nonspecific ST changes, roughly unchanged from previous.  Assessment & Plan by Problem:   Atypical chest pain - Patient presents with atypical chest pain, in the setting of known bone metastasis.  He does have a pathologic compression fracture at T5 but his symptoms appear to be higher than that dermatone.  However he has other bone mets throughout the thoracic spine.  Given his malignancy he is at risk for PE however this was ruled out with a CTA (which also rules out Aortic dissection).  That CTA did show a small pericardial effusion but review of chart shows that he had a small pericardial effusion as far back as 2013 without symptoms and is unlikely the cause of his current pain.  His HEART score is 3. - Admit to telemetry - Cycle Cardiac enzymes - AM EKG - NPO after MN. - Norco PRN, morphine for breakthrough     Cancer, metastatic to bone - Unknown primary, plan for endoscopy with biopsy with Dr. Ardis Hughs next week. To establish with Dr. Alen Blew ([place]) on 6/16. -  Norco 5/325 Q4PRN - Morphine 1-2 Q4PRN breakthrough pain.    Protein-calorie malnutrition, severe - In setting of metastatic CA.  Has lost 20 lbs in last 3 months.  Reports very poor PO intake. - Feeding supplement TID - Dietician consult    Hypercalcemia- Moderate - Ca 12.7 today was 9.8 1 month ago.  He does report a history of nephrolithiasis a few months ago, does have bone pain, no psychiatric changes. His hypercalcemia is likely due to he metastasis to his bones. - Will confirm hypercalcemia with a  Ionized Ca and obtain a PTH. - Do not need to treat acutely at this time.  Chronic Diarrhea - Continue Xifaxam, Bentyl  BPH - Continue Oxybutnin, Floxmax - Measure Post Void Residual, I&O cath PRN  DVT PPx: Heparin [place] Diet: Regular Code Status: Full Dispo: Disposition is deferred at this time, awaiting improvement of current medical problems. Anticipated discharge in approximately 1 day(s).   The patient does have a current PCP Colen Darling, MD) and does not need an [facility] hospital follow-up appointment after discharge.  The patient does not have transportation limitations that hinder transportation to clinic appointments.  Signed: Joni Reining, DO 04/26/2014, 3:03 PM

## 2014-04-26 NOTE — ED Provider Notes (Signed)
I saw and evaluated the patient, reviewed the resident's note and I agree with the findings and plan.   EKG Interpretation   Date/Time:  Thursday April 26 2014 09:54:17 EDT Ventricular Rate:  59 PR Interval:  165 QRS Duration: 90 QT Interval:  382 QTC Calculation: 378 R Axis:   55 Text Interpretation:  Age not entered, assumed to be  73 years old for  purpose of ECG interpretation Sinus rhythm ST elev, probable normal early  repol pattern No reciprocal changes No change compared to prior EKGs  Confirmed by WARD,  DO, KRISTEN [phone]) on 04/26/2014 10:02:03 AM       Pt is 73 y.o. M with recent diagnosis of possible pancreatic neoplasm who presents the emergency department 3 days of dyspnea with exertion and sharp left-sided chest pain that will last several minutes and then resolve. He denies a history of cardiac disease, hypertension, diabetes or hyperlipidemia. He was a prior smoker but quit many years ago. No fevers or cough. No history of PE or DVT. On exam, patient will speak in short sentences as he does appear to become dyspneic with talking while at rest he is not hypoxic. Oxygen saturation 98% with ambulation. His lungs are completely clear to auscultation with good aeration and no wheezing or rhonchi. Labs unremarkable other than slightly elevated creatinine which is baseline. CT chest shows no pulmonary embolus, edema, infiltrate or pneumothorax but he does have a small pericardial effusion and moderate ascites which may be contributing to his shortness of breath. Will admit for ACS rule out, echocardiogram given his small effusion.  [place], DO 04/26/14 1523

## 2014-04-26 NOTE — ED Notes (Signed)
Internal Medicine at bedside.  

## 2014-04-26 NOTE — ED Provider Notes (Signed)
CSN: [id number]     Arrival date & time 04/26/14  4825 History   First MD Initiated Contact with Patient 04/26/14 [phone]     Chief Complaint  Patient presents with  . Chest Pain    ucc transfer     (Consider location/radiation/quality/duration/timing/severity/associated sxs/prior Treatment) Patient is a 73 y.o. male presenting with chest pain. The history is provided by the patient, the spouse and medical records.  Chest Pain Pain location:  L chest Pain quality: sharp, shooting and tightness   Pain radiates to:  Does not radiate Pain radiates to the back: no   Pain severity:  Mild Onset quality:  Gradual Duration:  3 days Timing:  Constant Progression:  Worsening Chronicity:  New Context: at rest   Context: not breathing, not lifting and no trauma   Relieved by:  None tried Worsened by:  Nothing tried Ineffective treatments:  None tried Associated symptoms: shortness of breath   Associated symptoms: no abdominal pain, no anxiety, no back pain, no cough, no dizziness, no fever, no headache, no nausea, no numbness, no palpitations, no syncope, not vomiting and no weakness     Past Medical History  Diagnosis Date  . Depression   . Anxiety   . Unintentional weight loss   . Generalized headaches   . Diarrhea   . Difficulty urinating   . Arthritis   . Diverticulitis of colon   . Intrinsic asthma     mild   . GERD (gastroesophageal reflux disease)    Past Surgical History  Procedure Laterality Date  . Laparoscopic gastrotomy w/ repair of ulcer  1979  . Cystoscopy with retrograde pyelogram, ureteroscopy and stent placement Left 01/30/2014    Procedure: CYSTOSCOPY WITH RETROGRADE PYELOGRAM, URETEROSCOPY AND STENT PLACEMENT;  Surgeon: Molli Hazard, MD;  Location: [facility];  Service: Urology;  Laterality: Left;  . Holmium laser application Left 0/48/8891    Procedure: HOLMIUM LASER APPLICATION;  Surgeon: Molli Hazard, MD;  Location: [facility];  Service: Urology;  Laterality: Left;   Family History  Problem Relation Age of Onset  . Heart disease Mother   . Cancer Sister     unknown type   History  Substance Use Topics  . Smoking status: Former Smoker -- 0.25 packs/day for 10 years    Types: Cigarettes    Quit date: 11/16/1970  . Smokeless tobacco: Never Used  . Alcohol Use: 0.6 oz/week    1 Glasses of wine per week    Review of Systems  Constitutional: Negative for fever and chills.  HENT: Negative for congestion and rhinorrhea.   Eyes: Negative for pain.  Respiratory: Positive for shortness of breath. Negative for cough.        Dyspnea on exertion  Cardiovascular: Positive for chest pain. Negative for palpitations and syncope.  Gastrointestinal: Negative for nausea, vomiting, abdominal pain, diarrhea and constipation.  Endocrine: Negative for polydipsia and polyuria.  Genitourinary: Negative for dysuria and flank pain.  Musculoskeletal: Negative for back pain and neck pain.  Skin: Negative for color change and wound.  Neurological: Negative for dizziness, weakness, numbness and headaches.      Allergies  Tylenol with codeine #3  Home Medications   Prior to Admission medications   Medication Sig Start Date End Date Taking? Authorizing Provider  buPROPion (WELLBUTRIN XL) 150 MG 24 hr tablet Take 150 mg by mouth daily.    Historical Provider, MD  cephALEXin (KEFLEX) 500 MG capsule Take 1 capsule (500  mg total) by mouth 3 (three) times daily. 01/30/14   Sharyn Creamer, MD  dicyclomine (BENTYL) 10 MG capsule Take 1 tab twice daily as needed for spasms and cramping, diarrhea. 01/25/14   Amy S Esterwood, PA-C  doxazosin (CARDURA) 8 MG tablet Take 4 mg by mouth at bedtime.      Historical Provider, MD  doxepin (SINEQUAN) 50 MG capsule Take 50 mg by mouth at bedtime.      Historical Provider, MD  famotidine (PEPCID) 20 MG tablet Take 20 mg by mouth 2 (two) times daily.    Historical Provider, MD   gabapentin (NEURONTIN) 100 MG capsule Take 100 mg by mouth 5 (five) times daily. 5 times daily for nerve pain    Historical Provider, MD  HYDROcodone-acetaminophen (NORCO/VICODIN) 5-325 MG per tablet Take 1 tab every 4-6 hours as needed for pain. 04/23/14   Amy S Esterwood, PA-C  loperamide (ANTI-DIARRHEAL) 2 MG tablet Take 2 mg by mouth 4 (four) times daily as needed for diarrhea or loose stools.    Historical Provider, MD  oxybutynin (DITROPAN) 5 MG tablet Take 1 tablet (5 mg total) by mouth every 6 (six) hours as needed for bladder spasms. 01/30/14   Sharyn Creamer, MD  phenazopyridine (PYRIDIUM) 100 MG tablet Take 1 tablet (100 mg total) by mouth every 8 (eight) hours as needed for pain (Burning urination.  Will turn urine and body fluids orange.). 01/30/14   Sharyn Creamer, MD  rifaximin (XIFAXAN) 550 MG TABS tablet Take 550 mg by mouth 2 (two) times daily.    Historical Provider, MD  sildenafil (VIAGRA) 100 MG tablet Take 50 mg by mouth daily as needed for erectile dysfunction.     Historical Provider, MD  tamsulosin (FLOMAX) 0.4 MG CAPS capsule Take 1 capsule (0.4 mg total) by mouth daily. 01/26/14   Martie Lee, PA-C  Testosterone 20.25 MG/ACT (1.62%) GEL Place 2 application onto the skin daily. Pt uses 2 pumps    Historical Provider, MD  zolpidem (AMBIEN) 10 MG tablet Take 5 mg by mouth at bedtime as needed. sleep    Historical Provider, MD   BP 166/94  Pulse 68  Temp(Src) 98.2 F (36.8 C) (Oral)  Resp 18  SpO2 100% Physical Exam  Nursing note and vitals reviewed. Constitutional: He is oriented to person, place, and time. He appears cachectic.  HENT:  Head: Normocephalic and atraumatic.  Eyes: Conjunctivae and EOM are normal. Pupils are equal, round, and reactive to light.  Neck: Normal range of motion.  Cardiovascular: Normal rate and regular rhythm.   Pulmonary/Chest: Effort normal and breath sounds normal.  Abdominal: Soft. He exhibits no distension. There is no  tenderness.  Musculoskeletal: Normal range of motion. He exhibits no edema and no tenderness.  Neurological: He is alert and oriented to person, place, and time.  Skin: Skin is warm and dry.    ED Course  Procedures (including critical care time) Labs Review Labs Reviewed  COMPREHENSIVE METABOLIC PANEL - Abnormal; Notable for the following:    Glucose, Bld 123 (*)    Creatinine, Ser 1.56 (*)    Calcium 12.7 (*)    Alkaline Phosphatase 290 (*)    GFR calc non Af Amer 43 (*)    GFR calc Af Amer 50 (*)    All other components within normal limits  CBC - Abnormal; Notable for the following:    Hemoglobin 12.8 (*)    All other components within normal limits  CBC WITH  DIFFERENTIAL  PRO B NATRIURETIC PEPTIDE  PTH, INTACT AND CALCIUM  CREATININE, SERUM  TROPONIN I  TROPONIN I  TROPONIN I  Randolm Idol, ED    Imaging Review Dg Chest 2 View  04/26/2014   CLINICAL DATA:  Left-sided chest pain. Shortness of breath. Current history of metastatic disease of as yet unknown primary.  EXAM: CHEST  2 VIEW  COMPARISON:  PET-CT 04/20/2014.  Two-view chest x-ray 06/05/2013.  FINDINGS: Interval development of small bilateral pleural effusions since the PET-CT 6 days ago. Cardiac silhouette normal in size. Thoracic aorta minimally atherosclerotic. Hilar and mediastinal contours otherwise unremarkable. Lungs emphysematous though clear. 3 mm nodule in the inferior right upper lobe identified on PET-CT not visible on the chest x-ray. No pneumothorax. Mild degenerative changes involving the thoracic spine. Lytic lesions involving multiple thoracic vertebrae, better seen on the PET-CT.  IMPRESSION: 1. Small bilateral pleural effusions, new since the PET-CT 6 days ago. 2. No acute cardiopulmonary disease otherwise. 3. Osseous metastatic disease, better visualized on the PET-CT.   Electronically Signed   By: Evangeline Dakin M.D.   On: 04/26/2014 10:29   Ct Angio Chest Pe W/cm &/or Wo Cm  04/26/2014    CLINICAL DATA:  Metastatic disease unknown primary. Weight loss and chest pain. Rule out pulmonary embolism.  EXAM: CT ANGIOGRAPHY CHEST WITH CONTRAST  TECHNIQUE: Multidetector CT imaging of the chest was performed using the standard protocol during bolus administration of intravenous contrast. Multiplanar CT image reconstructions and MIPs were obtained to evaluate the vascular anatomy.  CONTRAST:  86mL OMNIPAQUE IOHEXOL 350 MG/ML SOLN  COMPARISON:  CT abdomen 01/26/2014.  PET-CT 04/20/2014  FINDINGS: Negative for pulmonary embolism. Negative for aortic aneurysm or dissection. Mild coronary artery calcification. Heart size within normal limits.  3 mm right upper lobe nodule anterior to the major fissure on axial image 55. This is unchanged from the prior PET-CT and is indeterminate. No additional nodules. Negative for pneumonia or effusion. No mediastinal adenopathy.  Extensive metastatic disease throughout the entire thoracic spine. Mild pathologic fracture of T5. If the patient has acute neurologic symptoms, consider MRI to evaluate the thoracic spinal cord and spinal canal.  Moderate ascites is present around the liver possibly due to malignancy. Small pericardial effusion.  Review of the MIP images confirms the above findings.  IMPRESSION: Negative for pulmonary embolism.  Widespread bony metastatic disease throughout the thoracic spine with a pathologic mild compression fracture of T5.  3 mm right upper lobe nodule, possibly metastatic disease however this could also be benign.  Moderate ascites.  Small pericardial effusion.   Electronically Signed   By: Franchot Gallo M.D.   On: 04/26/2014 12:10     EKG Interpretation   Date/Time:  Thursday April 26 2014 09:54:17 EDT Ventricular Rate:  59 PR Interval:  165 QRS Duration: 90 QT Interval:  382 QTC Calculation: 378 R Axis:   55 Text Interpretation:  Age not entered, assumed to be  73 years old for  purpose of ECG interpretation Sinus rhythm ST elev,  probable normal early  repol pattern No reciprocal changes No change compared to prior EKGs  Confirmed by WARD,  DO, KRISTEN [id number]) on 04/26/2014 10:02:03 AM      MDM   Final diagnoses:  Cancer, metastatic to bone  Pericardial effusion  Pleural effusion  SOB (shortness of breath)  Chest pain    73 yo m w/ h/o cancer of undetermined primary and BPH here with doe x 3 weeks, 3 days of  chest pain and 1 day of chest tightness associated with dyspnea. Weight loss 2/2 anorexia. Cachectic and low HR on exam, otherwise appears well. resp and card exam normal. Concern for ACS v PE v anemia. Ct scan with pericardial effusion and bilateral pleural effusions. Troponin, rest of labs wnl. No hypoxia with walking. Felt to need echo and likely acs rule out, admitted to hospital.     Merrily Pew, MD 04/26/14 [phone]

## 2014-04-26 NOTE — ED Provider Notes (Addendum)
CSN: [id number]     Arrival date & time 04/26/14  4098 History   First MD Initiated Contact with Patient 04/26/14 [phone]     Chief Complaint  Patient presents with  . Chest Pain   (Consider location/radiation/quality/duration/timing/severity/associated sxs/prior Treatment) HPI Comments: 73 year old male presents for evaluation of chest pain for the past 2 weeks. It is occurring on a daily basis. The pain remains for most of the day. It is located to the left anterior chest. He describes it as a shooting pain. It does not radiate. He denies heaviness, tightness, fullness or pressure. Denies shortness of breath, nausea, vomiting or diaphoresis. He has been told recently that he has an unknown type of cancer and is taking OxyContin which relieves his chest pain. Recent diagnoses includes abnormal weight loss, weakness and anemia.  Patient is a 73 y.o. male presenting with chest pain.  Chest Pain Associated symptoms: no cough, no diaphoresis, no fever and no shortness of breath     Past Medical History  Diagnosis Date  . Depression   . Anxiety   . Unintentional weight loss   . Generalized headaches   . Diarrhea   . Difficulty urinating   . Arthritis   . Diverticulitis of colon   . Intrinsic asthma     mild   . GERD (gastroesophageal reflux disease)    Past Surgical History  Procedure Laterality Date  . Laparoscopic gastrotomy w/ repair of ulcer  1979  . Cystoscopy with retrograde pyelogram, ureteroscopy and stent placement Left 01/30/2014    Procedure: CYSTOSCOPY WITH RETROGRADE PYELOGRAM, URETEROSCOPY AND STENT PLACEMENT;  Surgeon: Molli Hazard, MD;  Location: [facility];  Service: Urology;  Laterality: Left;  . Holmium laser application Left 4/78/2956    Procedure: HOLMIUM LASER APPLICATION;  Surgeon: Molli Hazard, MD;  [facility];  Service: Urology;  Laterality: Left;   Family History  Problem Relation Age of Onset  .  Heart disease Mother   . Cancer Sister     unknown type   History  Substance Use Topics  . Smoking status: Former Smoker -- 0.25 packs/day for 10 years    Types: Cigarettes    Quit date: 11/16/1970  . Smokeless tobacco: Never Used  . Alcohol Use: 0.6 oz/week    1 Glasses of wine per week    Review of Systems  Constitutional: Positive for activity change and appetite change. Negative for fever, chills and diaphoresis.  HENT: Negative.   Eyes: Negative for visual disturbance.  Respiratory: Negative for cough and shortness of breath.   Cardiovascular: Positive for chest pain. Negative for leg swelling.  Gastrointestinal: Negative.   Genitourinary: Negative.   Neurological: Negative for seizures, syncope and speech difficulty.  Psychiatric/Behavioral: Negative for confusion. The patient is nervous/anxious.     Allergies  Tylenol with codeine #3  Home Medications   Prior to Admission medications   Medication Sig Start Date End Date Taking? Authorizing Provider  buPROPion (WELLBUTRIN XL) 150 MG 24 hr tablet Take 150 mg by mouth daily.    Historical Provider, MD  cephALEXin (KEFLEX) 500 MG capsule Take 1 capsule (500 mg total) by mouth 3 (three) times daily. 01/30/14   Sharyn Creamer, MD  dicyclomine (BENTYL) 10 MG capsule Take 1 tab twice daily as needed for spasms and cramping, diarrhea. 01/25/14   Amy S Esterwood, PA-C  doxazosin (CARDURA) 8 MG tablet Take 4 mg by mouth at bedtime.      Historical Provider,  MD  doxepin (SINEQUAN) 50 MG capsule Take 50 mg by mouth at bedtime.      Historical Provider, MD  famotidine (PEPCID) 20 MG tablet Take 20 mg by mouth 2 (two) times daily.    Historical Provider, MD  gabapentin (NEURONTIN) 100 MG capsule Take 100 mg by mouth 5 (five) times daily. 5 times daily for nerve pain    Historical Provider, MD  HYDROcodone-acetaminophen (NORCO/VICODIN) 5-325 MG per tablet Take 1 tab every 4-6 hours as needed for pain. 04/23/14   Amy S Esterwood, PA-C   loperamide (ANTI-DIARRHEAL) 2 MG tablet Take 2 mg by mouth 4 (four) times daily as needed for diarrhea or loose stools.    Historical Provider, MD  oxybutynin (DITROPAN) 5 MG tablet Take 1 tablet (5 mg total) by mouth every 6 (six) hours as needed for bladder spasms. 01/30/14   Sharyn Creamer, MD  phenazopyridine (PYRIDIUM) 100 MG tablet Take 1 tablet (100 mg total) by mouth every 8 (eight) hours as needed for pain (Burning urination.  Will turn urine and body fluids orange.). 01/30/14   Sharyn Creamer, MD  rifaximin (XIFAXAN) 550 MG TABS tablet Take 550 mg by mouth 2 (two) times daily.    Historical Provider, MD  sildenafil (VIAGRA) 100 MG tablet Take 50 mg by mouth daily as needed for erectile dysfunction.     Historical Provider, MD  tamsulosin (FLOMAX) 0.4 MG CAPS capsule Take 1 capsule (0.4 mg total) by mouth daily. 01/26/14   Martie Lee, PA-C  Testosterone 20.25 MG/ACT (1.62%) GEL Place 2 application onto the skin daily. Pt uses 2 pumps    Historical Provider, MD  zolpidem (AMBIEN) 10 MG tablet Take 5 mg by mouth at bedtime as needed. sleep    Historical Provider, MD   BP 132/78  Pulse 58  Temp(Src) 98.6 F (37 C) (Oral)  Resp 18  SpO2 100% Physical Exam  Nursing note and vitals reviewed. Constitutional: He is oriented to person, place, and time. He appears well-developed and well-nourished. No distress.  Eyes: Conjunctivae and EOM are normal. Pupils are equal, round, and reactive to light.  Neck: Normal range of motion. Neck supple.  Cardiovascular: Normal rate, regular rhythm, normal heart sounds and intact distal pulses.   Pulmonary/Chest: Effort normal and breath sounds normal. No respiratory distress. He has no wheezes. He has no rales.  Abdominal: Soft. There is no tenderness.  Vertical midline scar from herniorrhaphy several decades ago. There is a old mobile mass just inferior to the umbilicus.  Musculoskeletal: He exhibits no edema and no tenderness.  Lymphadenopathy:     He has no cervical adenopathy.  Neurological: He is alert and oriented to person, place, and time. He exhibits normal muscle tone.  Skin: Skin is warm and dry.  Psychiatric: He has a normal mood and affect.    ED Course  Procedures (including critical care time) Labs Review Labs Reviewed - No data to display  Imaging Review No results found.   MDM   1. Chest pain    Transfer to [facility] ED. via EMS with O2, IV and monitor. The patient is stable and in no distress. No chest pain.    Janne Napoleon, NP 04/26/14 1730  Janne Napoleon, NP 04/26/14 1731  Janne Napoleon, NP 07/17/14 1725  Janne Napoleon, NP 07/17/14 1726  Janne Napoleon, NP 09/22/14 [phone]

## 2014-04-26 NOTE — H&P (Signed)
  I have seen and examined the patient myself, and I have reviewed the note by Arrie Aran, MS 3 and was present during the interview and physical exam.  Please see my separate H&P for additional findings, assessment, and plan.   Signed: Joni Reining, DO 04/26/2014, 7:54 PM

## 2014-04-26 NOTE — ED Notes (Signed)
Pt ambulated with pulse ox and no O2.  No decrease in O2 or increase in HR.  MD notified.

## 2014-04-26 NOTE — ED Notes (Signed)
Placed  On  Cardiac  Monitor  And  Nasal  02  At  2  l  /  Min       

## 2014-04-26 NOTE — ED Notes (Signed)
Pt  Reports        Chest  Pain     Which  He  Reports  He  Has  Had  In  Past   And  Became  Worse  Last pm         Become    More  Intense  This  Am    Pain is  Rated  At  An  8     -   Describes  It as  Hervey Ard

## 2014-04-26 NOTE — H&P (Signed)
Date: 04/26/2014               Patient Name:  Daniel Huynh MRN: [id number]  DOB: 08-20-1941 Age / Sex: 73 y.o., male   PCP: Daniel Darling, MD              Medical Service: Internal Medicine Teaching Service              Attending Physician: Dr. [name], MD    First Contact: Daniel Aran, MS 3 Pager: [phone]  Second Contact: Dr. Joni Huynh Pager: [phone]  Third Contact Dr. Randell Huynh Pager: [phone]       After Hours (After 5p/  First Contact Pager: [phone]  weekends / holidays): Second Contact Pager: [phone]   Chief Complaint: Chest Pain  History of Present Illness:   Daniel Huynh is a 27y gentleman with a PMH of anxiety, BPH, weight loss, newly diagnosed metastatic cancer (unknown primary), who presented to the ED this morning with chest pain.  The pain started about a month ago after a golf tournament.  Initially, he thought it was a muscle strain, and would resolve on its own.  Last night, it became acutely worse, and he decided to come to the ED at [facility] this morning after it was not relieved by sleep.  The pain is located over the L 2nd rib just medial to the anterior axillary line, is intermittently sharp, worse when he torques his torso to the right, was relieved by hydrocodone, radiates to the back along a dermatome only when he feels the intermittent sharp pain.  He denies any shortness of breath, diaphoresis, recent cough.    Meds: No current facility-administered medications for this encounter.    Allergies: Allergies as of 04/26/2014 - Review Complete 04/26/2014  Allergen Reaction Noted  . Tylenol with codeine #3 [acetaminophen-codeine] Rash 07/13/2012   Past Medical History  Diagnosis Date  . Depression   . Anxiety   . Unintentional weight loss   . Generalized headaches   . Diarrhea   . Difficulty urinating   . Arthritis   . Diverticulitis of colon   . Intrinsic asthma     mild   . GERD (gastroesophageal reflux disease)    Past  Surgical History  Procedure Laterality Date  . Laparoscopic gastrotomy w/ repair of ulcer  1979  . Cystoscopy with retrograde pyelogram, ureteroscopy and stent placement Left 01/30/2014    Procedure: CYSTOSCOPY WITH RETROGRADE PYELOGRAM, URETEROSCOPY AND STENT PLACEMENT;  Surgeon: Molli Hazard, MD;  Location: [facility];  Service: Urology;  Laterality: Left;  . Holmium laser application Left 3/32/9518    Procedure: HOLMIUM LASER APPLICATION;  Surgeon: Molli Hazard, MD;  Location: [facility];  Service: Urology;  Laterality: Left;   Family History  Problem Relation Age of Onset  . Heart disease Mother   . Cancer Sister     unknown type   History   Social History  . Marital Status: Married    Spouse Name: N/A    Number of Children: N/A  . Years of Education: N/A   Occupational History  . Not on file.   Social History Main Topics  . Smoking status: Former Smoker -- 0.25 packs/day for 10 years    Types: Cigarettes    Quit date: 11/16/1970  . Smokeless tobacco: Never Used  . Alcohol Use: 0.6 oz/week    1 Glasses of wine per week  . Drug Use: No  . Sexual Activity:  Not on file   Other Topics Concern  . Not on file   Social History Narrative  . No narrative on file    Review of Systems: Constitutional: positive for anorexia and weight loss, negative for fevers and sweats Eyes: negative Ears, nose, mouth, throat, and face: negative Respiratory: positive for asthma and pleurisy/chest pain, negative for chronic bronchitis, cough, sputum and wheezing Cardiovascular: positive for chest pain, fatigue and palpitations, negative for chest pressure/discomfort, claudication, dyspnea and exertional chest pressure/discomfort Gastrointestinal: positive for abdominal pain, diarrhea, dyspepsia, nausea and reflux symptoms, negative for change in bowel habits, jaundice, melena and vomiting Genitourinary:positive for decreased stream, hesitancy  and recent kidney stone, negative for dysuria, frequency and hematuria Integument/breast: positive for breast lump and breast tenderness, negative for nipple discharge, rash and skin color change Musculoskeletal:positive for back pain and bone pain, negative for neck pain and stiff joints Behavioral/Psych: positive for anxiety, decreased appetite, depression and fatigue   Physical Exam: Blood pressure 149/79, pulse 68, temperature 98.6 F (37 C), temperature source Oral, resp. rate 14, height 5\' 11"  (1.803 m), weight 58.06 kg (128 lb), SpO2 92.00%. General appearance: alert, cooperative, appears stated age, cachectic and no distress Head: Normocephalic, without obvious abnormality, male-pattern baldness Eyes: conjunctivae/corneas clear. PERRL, EOM's intact. Fundi benign. Neck: no adenopathy, no carotid bruit, no JVD, supple, symmetrical, trachea midline and thyroid not enlarged, symmetric, no tenderness/mass/nodules Back: symmetric, normal curvature, nl ROM, + CVA tenderness bilaterally, point tenderness above T3 and multiple lumber spinous processes Lungs: clear to auscultation bilaterally Chest wall: left sided chest wall tenderness, "puffiness" described by patient correlates to apex of pectoralis major Heart: regular rate and rhythm, S1, S2 normal, no murmur, click, rub or gallop Abdomen: diffuse tenderness, LLQ> LUQ>RUQ, suprapubic tenderness, soft, nondistended, nl BS Extremities: extremities normal, atraumatic, no cyanosis or edema Pulses: 2+ and symmetric Skin: Skin color, texture, turgor normal. No rashes or lesions Neurologic: Grossly normal  Lab results: Results for Daniel, Huynh (MRN [id number]) as of 04/26/2014 15:13  Ref. Range 04/26/2014 10:47 04/26/2014 10:50  Sodium Latest Range: 137-147 mEq/L 142   Potassium Latest Range: 3.7-5.3 mEq/L 4.4   Chloride Latest Range: 96-112 mEq/L 96   CO2 Latest Range: 19-32 mEq/L 31   BUN Latest Range: 6-23 mg/dL 19   Creatinine Latest  Range: 0.50-1.35 mg/dL 1.56 (H)   Calcium Latest Range: 8.4-10.5 mg/dL 12.7 (H)   GFR calc non Af Amer Latest Range: >90 mL/min 43 (L)   GFR calc Af Amer Latest Range: >90 mL/min 50 (L)   Glucose Latest Range: 70-99 mg/dL 123 (H)   Alkaline Phosphatase Latest Range: 39-117 U/L 290 (H)   Albumin Latest Range: 3.5-5.2 g/dL 3.7   AST Latest Range: 0-37 U/L 36   ALT Latest Range: 0-53 U/L 18   Total Protein Latest Range: 6.0-8.3 g/dL 7.2   Total Bilirubin Latest Range: 0.3-1.2 mg/dL 0.5   Troponin i, poc Latest Range: 0.00-0.08 ng/mL  0.00  Pro B Natriuretic peptide (BNP) Latest Range: 0-125 pg/mL 93.2   WBC Latest Range: 4.0-10.5 K/uL 6.0   RBC Latest Range: 4.22-5.81 MIL/uL 4.71   Hemoglobin Latest Range: 13.0-17.0 g/dL 13.2   HCT Latest Range: 39.0-52.0 % 40.6   MCV Latest Range: 78.0-100.0 fL 86.2   MCH Latest Range: 26.0-34.0 pg 28.0   MCHC Latest Range: 30.0-36.0 g/dL 32.5   RDW Latest Range: 11.5-15.5 % 13.2   Platelets Latest Range: 150-400 K/uL 165   Neutrophils Relative % Latest Range: 43-77 % 67  Lymphocytes Relative Latest Range: 12-46 % 18   Monocytes Relative Latest Range: 3-12 % 10   Eosinophils Relative Latest Range: 0-5 % 5   Basophils Relative Latest Range: 0-1 % 0   NEUT# Latest Range: 1.7-7.7 K/uL 4.0   Lymphocytes Absolute Latest Range: 0.7-4.0 K/uL 1.1   Monocytes Absolute Latest Range: 0.1-1.0 K/uL 0.6   Eosinophils Absolute Latest Range: 0.0-0.7 K/uL 0.3   Basophils Absolute Latest Range: 0.0-0.1 K/uL 0.0     Imaging results:  Dg Chest 2 View  04/26/2014   CLINICAL DATA:  Left-sided chest pain. Shortness of breath. Current history of metastatic disease of as yet unknown primary.  EXAM: CHEST  2 VIEW  COMPARISON:  PET-CT 04/20/2014.  Two-view chest x-ray 06/05/2013.  FINDINGS: Interval development of small bilateral pleural effusions since the PET-CT 6 days ago. Cardiac silhouette normal in size. Thoracic aorta minimally atherosclerotic. Hilar and mediastinal  contours otherwise unremarkable. Lungs emphysematous though clear. 3 mm nodule in the inferior right upper lobe identified on PET-CT not visible on the chest x-ray. No pneumothorax. Mild degenerative changes involving the thoracic spine. Lytic lesions involving multiple thoracic vertebrae, better seen on the PET-CT.  IMPRESSION: 1. Small bilateral pleural effusions, new since the PET-CT 6 days ago. 2. No acute cardiopulmonary disease otherwise. 3. Osseous metastatic disease, better visualized on the PET-CT.   Electronically Signed   By: Evangeline Dakin M.D.   On: 04/26/2014 10:29   Ct Angio Chest Pe W/cm &/or Wo Cm  04/26/2014   CLINICAL DATA:  Metastatic disease unknown primary. Weight loss and chest pain. Rule out pulmonary embolism.  EXAM: CT ANGIOGRAPHY CHEST WITH CONTRAST  TECHNIQUE: Multidetector CT imaging of the chest was performed using the standard protocol during bolus administration of intravenous contrast. Multiplanar CT image reconstructions and MIPs were obtained to evaluate the vascular anatomy.  CONTRAST:  47mL OMNIPAQUE IOHEXOL 350 MG/ML SOLN  COMPARISON:  CT abdomen 01/26/2014.  PET-CT 04/20/2014  FINDINGS: Negative for pulmonary embolism. Negative for aortic aneurysm or dissection. Mild coronary artery calcification. Heart size within normal limits.  3 mm right upper lobe nodule anterior to the major fissure on axial image 55. This is unchanged from the prior PET-CT and is indeterminate. No additional nodules. Negative for pneumonia or effusion. No mediastinal adenopathy.  Extensive metastatic disease throughout the entire thoracic spine. Mild pathologic fracture of T5. If the patient has acute neurologic symptoms, consider MRI to evaluate the thoracic spinal cord and spinal canal.  Moderate ascites is present around the liver possibly due to malignancy. Small pericardial effusion.  Review of the MIP images confirms the above findings.  IMPRESSION: Negative for pulmonary embolism.  Widespread  bony metastatic disease throughout the thoracic spine with a pathologic mild compression fracture of T5.  3 mm right upper lobe nodule, possibly metastatic disease however this could also be benign.  Moderate ascites.  Small pericardial effusion.   Electronically Signed   By: Franchot Gallo M.D.   On: 04/26/2014 12:10    Other results: EKG: sinus bradycardia, with non-specific repolarization abnormalities not present in previous EKG.  Assessment & Plan by Problem: Principal Problem:   Atypical chest pain Active Problems:   Diarrhea   Cancer, metastatic to bone   Protein-calorie malnutrition, severe   Hypercalcemia   Chest pain, atypical  Atypical Chest Pain - Likely MSK pain, given sharp, positional character, length of onset, lack of associated symptoms, and known metastatic disease.  CTA not consistent with PE or aortic dissection.  Acute  increase in severity and EKG changes necessitates r/o of MACE. - admit to telemetry - trend troponins q6h - ASA 325mg  daily - Protonix 40mg  - EKG in AM - Norco 5-325mg  q4h PRN for pain - morphine 2mg  q4h PRN for breakthrough pain - zofran 4mg  q6h PRN - NPO after MN for AM Echo  Hypercalcemia - likely 2/2 increased bone resorption with extensive metastatic disease, which would also account for recent kidney stones.  EKG changes are consistent with hypercalcemia. - NS 193mL/h  - repeat BMET in AM  Diarrhea - chronic issue for him; he is at baseline - continue home Xifaxam 550mg  BID - continue home bentyl 10mg  BID PRN  BPH, with urinary retention -continue home doxazosin 4mg  daily - continue home oxbutynin 5mg  q6h PRN - measure post residual void - catheterize if pt unable to void  Cancer, metastatic to bone - Newly diagnosed, will be followed by Dr. Alen Blew.  Has had significant weight loss in past year 2/2 malignancy - Consult Nutrition, appreciate rec's - provide supplemental nutrition shakes with protein  Neuro - continue home  wellbutrin, neurontin, ambien 5mg  qhs - hold home doxepin, pyridium  Diet: Regular, NPO after MN DVT PPx - ambulate Dispo - Awaiting improvement and evaluation of current medical problems.  Anticipated discharge in 1-2 day(s).   This is a Careers information officer Note.  The care of the patient was discussed with Dr. Heber Camilla and the assessment and plan was formulated with their assistance.  Please see their note for official documentation of the patient encounter.   Signed: Barbie Haggis, Med Student 04/26/2014, 2:57 PM

## 2014-04-26 NOTE — ED Notes (Signed)
Pt tx here from [facility] for chest pain x 3 days.  Recent loss of 30 lbs and recent dx of CA, though waiting on official dx.

## 2014-04-27 ENCOUNTER — Other Ambulatory Visit: Payer: Self-pay | Admitting: Oncology

## 2014-04-27 DIAGNOSIS — N400 Enlarged prostate without lower urinary tract symptoms: Secondary | ICD-10-CM

## 2014-04-27 DIAGNOSIS — C7951 Secondary malignant neoplasm of bone: Secondary | ICD-10-CM

## 2014-04-27 DIAGNOSIS — E43 Unspecified severe protein-calorie malnutrition: Secondary | ICD-10-CM

## 2014-04-27 DIAGNOSIS — R079 Chest pain, unspecified: Secondary | ICD-10-CM

## 2014-04-27 DIAGNOSIS — C801 Malignant (primary) neoplasm, unspecified: Secondary | ICD-10-CM

## 2014-04-27 DIAGNOSIS — C7952 Secondary malignant neoplasm of bone marrow: Principal | ICD-10-CM

## 2014-04-27 DIAGNOSIS — R197 Diarrhea, unspecified: Secondary | ICD-10-CM

## 2014-04-27 LAB — BASIC METABOLIC PANEL
BUN: 18 mg/dL (ref 6–23)
CHLORIDE: 99 meq/L (ref 96–112)
CO2: 29 mEq/L (ref 19–32)
Calcium: 12 mg/dL — ABNORMAL HIGH (ref 8.4–10.5)
Creatinine, Ser: 1.45 mg/dL — ABNORMAL HIGH (ref 0.50–1.35)
GFR calc non Af Amer: 47 mL/min — ABNORMAL LOW (ref >90)
GFR, EST AFRICAN AMERICAN: 54 mL/min — AB (ref >90)
Glucose, Bld: 98 mg/dL (ref 70–99)
Potassium: 3.7 mEq/L (ref 3.7–5.3)
Sodium: 141 mEq/L (ref 137–147)

## 2014-04-27 LAB — TROPONIN I

## 2014-04-27 LAB — PTH, INTACT AND CALCIUM
Calcium, Total (PTH): 11.2 mg/dL — ABNORMAL HIGH (ref 8.4–10.5)
PTH: 7.4 pg/mL — ABNORMAL LOW (ref 14.0–72.0)

## 2014-04-27 MED ORDER — BOOST PLUS PO LIQD
237.0000 mL | Freq: Three times a day (TID) | ORAL | Status: DC
  Filled 2014-04-27 (×5): qty 237

## 2014-04-27 MED ORDER — BOOST PLUS PO LIQD: 237.0000 mL | Freq: Three times a day (TID) | ORAL | Status: AC

## 2014-04-27 MED ORDER — SODIUM CHLORIDE 0.9 % IV SOLN
90.0000 mg | Freq: Once | INTRAVENOUS | Status: AC
  Administered 2014-04-27: 90 mg via INTRAVENOUS
  Filled 2014-04-27 (×2): qty 10

## 2014-04-27 NOTE — Progress Notes (Signed)
UR completed 

## 2014-04-27 NOTE — Progress Notes (Signed)
INITIAL NUTRITION ASSESSMENT  DOCUMENTATION CODES Per approved criteria  -Severe malnutrition in the context of chronic illness  Pt meets criteria for severe MALNUTRITION in the context of chronic illness as evidenced by severe fat and muscle wasting and wt loss of 10% in <6 months.  INTERVENTION: - Boost Plus TID - Pt educated on ways to increase calories and protein in diet at home.  - Continue to encourage PO intake - RD to continue to follow for nutrition care plan  NUTRITION DIAGNOSIS: Inadequate oral intake related to chest pain as evidenced by reported intake less than estimated needs and wt loss.   Goal: Pt to meet >/= 90% of their estimated nutrition needs  Monitor:  Wt, po intake, acceptance of supplements  Reason for Assessment: MST, Consult  73 y.o. male  Admitting Dx: Atypical chest pain  ASSESSMENT: 73 yo AA male with PMH of newly diagnosed Metastatic cancer (unknown primary), BPH, Anxiety, and weight loss. He reports that the pain is located over his left anterior chest, he notes that it is worse with certain movements. He notes that he first appreciated this specific pain 3 weeks to a month ago after a golf outing, initially he thought this was a muscle strain and would go away. However last night the pain became worse and was not relieved in the morning so he went to Urgent Care for further evaluation.  Pt reports his usual body weight as ~140 lbs 5-6 months ago. He drinks Boost at home (once daily). He attributes his weight loss to pain and poor appetite. Pt also experiences chronic diarrhea. Pt is lactose intolerant.   Nutrition Focused Physical Exam:  Subcutaneous Fat:  Orbital Region: severe wasting Upper Arm Region: severe wasting Thoracic and Lumbar Region: severe wasting  Muscle:  Temple Region: severe wasting Clavicle Bone Region: severe wasting Clavicle and Acromion Bone Region: severe wasting Scapular Bone Region: moderate wasting Dorsal Hand:  moderate wasting Patellar Region: severe wasting Anterior Thigh Region: moderate wasting Posterior Calf Region: moderate wasting  Edema: none   Height: Ht Readings from Last 1 Encounters:  04/26/14 5\' 11"  (1.803 m)    Weight: Wt Readings from Last 1 Encounters:  04/27/14 126 lb 4.8 oz (57.289 kg)    Ideal Body Weight: 70.8 kg  % Ideal Body Weight: 81%  Wt Readings from Last 10 Encounters:  04/27/14 126 lb 4.8 oz (57.289 kg)  04/23/14 128 lb (58.06 kg)  03/23/14 140 lb (63.504 kg)  01/30/14 146 lb (66.225 kg)  01/30/14 146 lb (66.225 kg)  01/29/14 146 lb (66.225 kg)  01/26/14 148 lb (67.132 kg)  01/25/14 146 lb 3.2 oz (66.316 kg)  08/28/13 155 lb 8 oz (70.534 kg)  08/04/13 153 lb (69.4 kg)    Usual Body Weight: 140 lbs, 5-6 months ago  % Usual Body Weight: 90%  BMI:  Body mass index is 17.62 kg/(m^2).  Estimated Nutritional Needs: Kcal: 1700-1900 Protein: 75-85 g Fluid: 1.7-1.9 L/day  Skin: intact  Diet Order: General  EDUCATION NEEDS: -Education needs addressed  No intake or output data in the 24 hours ending 04/27/14 0953  Last BM: PTA   Labs:   Recent Labs Lab 04/24/14 1225 04/26/14 1047 04/26/14 1600 04/27/14 0400  NA 139 142  --  141  K 4.8 4.4  --  3.7  CL 99 96  --  99  CO2 33* 31  --  29  BUN 18 19  --  18  CREATININE 1.6* 1.56* 1.55* 1.45*  CALCIUM 12.6* 12.7*  --  12.0*  GLUCOSE 117* 123*  --  98    CBG (last 3)  No results found for this basename: GLUCAP,  in the last 72 hours  Scheduled Meds: . aspirin EC  325 mg Oral Daily  . buPROPion  150 mg Oral Daily  . doxazosin  4 mg Oral QHS  . feeding supplement (RESOURCE BREEZE)  1 Container Oral TID BM  . gabapentin  100 mg Oral 5 X Daily  . heparin  5,000 Units Subcutaneous 3 times per day  . pantoprazole  40 mg Oral Daily  . rifaximin  550 mg Oral BID  . sodium chloride  3 mL Intravenous Q12H  . sodium chloride  3 mL Intravenous Q12H  . tamsulosin  0.4 mg Oral Daily     Continuous Infusions:   Past Medical History  Diagnosis Date  . Depression   . Anxiety   . Unintentional weight loss   . Diarrhea   . Difficulty urinating   . Arthritis   . Diverticulitis of colon   . Intrinsic asthma     mild   . GERD (gastroesophageal reflux disease)   . Generalized headaches     "monthly" (04/26/2014)  . Metastasis     unknown type/notes 04/26/2014    Past Surgical History  Procedure Laterality Date  . Laparoscopic gastrotomy w/ repair of ulcer  1979  . Cystoscopy with retrograde pyelogram, ureteroscopy and stent placement Left 01/30/2014    Procedure: CYSTOSCOPY WITH RETROGRADE PYELOGRAM, URETEROSCOPY AND STENT PLACEMENT;  Surgeon: Molli Hazard, MD;  Location: [facility];  Service: Urology;  Laterality: Left;  . Holmium laser application Left 8/36/6294    Procedure: HOLMIUM LASER APPLICATION;  Surgeon: Molli Hazard, MD;  Location: [facility];  Service: Urology;  Laterality: Left;  . Tonsillectomy      "as a kid"    Terrace Arabia RD, LDN

## 2014-04-27 NOTE — Progress Notes (Signed)
Reviewed discharge instructions with patient and wife, they stated their understanding.  Follow up with oncology next week.  Will discharge home with wife.  Sanda Linger

## 2014-04-27 NOTE — Progress Notes (Signed)
Subjective: Patient reports pain has been much better controlled overnight. Has only received Norco 5/325 Q4PRN>>> has not needed morphine for breakthrough pain.  He still has occasional pains with certain movements.   Objective: Vital signs in last 24 hours: Filed Vitals:   04/26/14 1332 04/26/14 1450 04/26/14 2028 04/27/14 0442  BP: 157/89 149/79 120/77 107/67  Pulse:  68 72 71  Temp: 98.3 F (36.8 C) 98.6 F (37 C) 98 F (36.7 C) 97.7 F (36.5 C)  TempSrc: Oral Oral Oral Oral  Resp: 15 14 18 18   Height:      Weight:    126 lb 4.8 oz (57.289 kg)  SpO2: 98% 92% 96% 95%   Weight change:  No intake or output data in the 24 hours ending 04/27/14 0831 General: resting in bed Cardiac: RRR, no murmurs  Pulm: CTAB Abd: soft, nontender,BS present Ext: warm and well perfused, no pedal edema  Lab Results: Basic Metabolic Panel:  Recent Labs Lab 04/26/14 1047 04/26/14 1600 04/27/14 0400  NA 142  --  141  K 4.4  --  3.7  CL 96  --  99  CO2 31  --  29  GLUCOSE 123*  --  98  BUN 19  --  18  CREATININE 1.56* 1.55* 1.45*  CALCIUM 12.7*  --  12.0*   Liver Function Tests:  Recent Labs Lab 04/24/14 1225 04/26/14 1047  AST 31 36  ALT 16 18  ALKPHOS 216* 290*  BILITOT 0.7 0.5  PROT 7.1 7.2  ALBUMIN 3.9 3.7   No results found for this basename: LIPASE, AMYLASE,  in the last 168 hours No results found for this basename: AMMONIA,  in the last 168 hours CBC:  Recent Labs Lab 04/24/14 1225 04/26/14 1047 04/26/14 1600  WBC 6.7 6.0 6.8  NEUTROABS 4.8 4.0  --   HGB 13.6 13.2 12.8*  HCT 41.7 40.6 39.4  MCV 85.6 86.2 85.1  PLT 193.0 165 154   Cardiac Enzymes:  Recent Labs Lab 04/26/14 1600 04/26/14 2140 04/27/14 0346  TROPONINI <0.30 <0.30 <0.30   BNP:  Recent Labs Lab 04/26/14 1047  PROBNP 93.2   Micro Results: No results found for this or any previous visit (from the past 240 hour(s)). Studies/Results: Dg Chest 2 View  04/26/2014   CLINICAL DATA:   Left-sided chest pain. Shortness of breath. Current history of metastatic disease of as yet unknown primary.  EXAM: CHEST  2 VIEW  COMPARISON:  PET-CT 04/20/2014.  Two-view chest x-ray 06/05/2013.  FINDINGS: Interval development of small bilateral pleural effusions since the PET-CT 6 days ago. Cardiac silhouette normal in size. Thoracic aorta minimally atherosclerotic. Hilar and mediastinal contours otherwise unremarkable. Lungs emphysematous though clear. 3 mm nodule in the inferior right upper lobe identified on PET-CT not visible on the chest x-ray. No pneumothorax. Mild degenerative changes involving the thoracic spine. Lytic lesions involving multiple thoracic vertebrae, better seen on the PET-CT.  IMPRESSION: 1. Small bilateral pleural effusions, new since the PET-CT 6 days ago. 2. No acute cardiopulmonary disease otherwise. 3. Osseous metastatic disease, better visualized on the PET-CT.   Electronically Signed   By: Evangeline Dakin M.D.   On: 04/26/2014 10:29   Ct Angio Chest Pe W/cm &/or Wo Cm  04/26/2014   CLINICAL DATA:  Metastatic disease unknown primary. Weight loss and chest pain. Rule out pulmonary embolism.  EXAM: CT ANGIOGRAPHY CHEST WITH CONTRAST  TECHNIQUE: Multidetector CT imaging of the chest was performed using the standard protocol  during bolus administration of intravenous contrast. Multiplanar CT image reconstructions and MIPs were obtained to evaluate the vascular anatomy.  CONTRAST:  58mL OMNIPAQUE IOHEXOL 350 MG/ML SOLN  COMPARISON:  CT abdomen 01/26/2014.  PET-CT 04/20/2014  FINDINGS: Negative for pulmonary embolism. Negative for aortic aneurysm or dissection. Mild coronary artery calcification. Heart size within normal limits.  3 mm right upper lobe nodule anterior to the major fissure on axial image 55. This is unchanged from the prior PET-CT and is indeterminate. No additional nodules. Negative for pneumonia or effusion. No mediastinal adenopathy.  Extensive metastatic disease  throughout the entire thoracic spine. Mild pathologic fracture of T5. If the patient has acute neurologic symptoms, consider MRI to evaluate the thoracic spinal cord and spinal canal.  Moderate ascites is present around the liver possibly due to malignancy. Small pericardial effusion.  Review of the MIP images confirms the above findings.  IMPRESSION: Negative for pulmonary embolism.  Widespread bony metastatic disease throughout the thoracic spine with a pathologic mild compression fracture of T5.  3 mm right upper lobe nodule, possibly metastatic disease however this could also be benign.  Moderate ascites.  Small pericardial effusion.   Electronically Signed   By: Franchot Gallo M.D.   On: 04/26/2014 12:10   Medications: I have reviewed the patient's current medications. Scheduled Meds: . aspirin EC  325 mg Oral Daily  . buPROPion  150 mg Oral Daily  . doxazosin  4 mg Oral QHS  . feeding supplement (RESOURCE BREEZE)  1 Container Oral TID BM  . gabapentin  100 mg Oral 5 X Daily  . heparin  5,000 Units Subcutaneous 3 times per day  . pantoprazole  40 mg Oral Daily  . rifaximin  550 mg Oral BID  . sodium chloride  3 mL Intravenous Q12H  . sodium chloride  3 mL Intravenous Q12H  . tamsulosin  0.4 mg Oral Daily   Continuous Infusions:  PRN Meds:.sodium chloride, dicyclomine, HYDROcodone-acetaminophen, morphine injection, ondansetron (ZOFRAN) IV, ondansetron, oxybutynin, sodium chloride, zolpidem Assessment/Plan:   Atypical chest pain  - Ruled out for ACS with negative serial enzymes.  Patient is likely referred from thoracic spine mets versus ribs mets that could not be visualized on CT.  Cardiology has reviewed his chart and suggest no further cardiac workup. - Patient pain controlled with home Norco dose.  Has follow up appointment with Oncology in 4 days. - D/C home today    Constipation - Last BM ~3 days ago, instructed to stop immodium while taking narcotic pain medications (Has history  of chronic diarrhea)    Cancer, metastatic to bone - F/U with Dr. Alen Blew on 6/16.    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 Alen Blew, will treat with Pamidronate in hospital. As paitent may become more symptomatic prior to Tuesday appointment if left untreated.  He will follow up Ca at that visit.  Dispo: Discharge home today after Pamidronate infusion.  The patient does have a current PCP Colen Darling, MD) and does not need an [facility] hospital follow-up appointment after discharge.  The patient does not have transportation limitations that hinder transportation to clinic appointments.  .Services Needed at time of discharge: Y = Yes, Blank = No PT:   OT:   RN:   Equipment:   Other:     LOS: 1 day   Joni Reining, DO 04/27/2014, 8:31 AM

## 2014-04-27 NOTE — Discharge Summary (Signed)
Name: Daniel Huynh MRN: [id number] DOB: 05/20/1941 73 y.o. PCP: Colen Darling, MD  Date of Admission: 04/26/2014  9:42 AM Date of Discharge: 04/27/2014 Attending Physician: [name], MD  Discharge Diagnosis: Principal Problem:   Atypical chest pain Active Problems:   Diarrhea   Cancer, metastatic to bone   Protein-calorie malnutrition, severe   Hypercalcemia of malignancy  Discharge Medications:   Medication List    STOP taking these medications       ANTI-DIARRHEAL 2 MG tablet  Generic drug:  loperamide      TAKE these medications       buPROPion 150 MG 24 hr tablet  Commonly known as:  WELLBUTRIN XL  Take 150 mg by mouth daily.     dicyclomine 10 MG capsule  Commonly known as:  BENTYL  Take 1 tab twice daily as needed for spasms and cramping, diarrhea.     doxazosin 8 MG tablet  Commonly known as:  CARDURA  Take 4 mg by mouth at bedtime.     doxepin 50 MG capsule  Commonly known as:  SINEQUAN  Take 50 mg by mouth at bedtime.     famotidine 20 MG tablet  Commonly known as:  PEPCID  Take 20 mg by mouth 2 (two) times daily.     gabapentin 100 MG capsule  Commonly known as:  NEURONTIN  Take 100 mg by mouth 5 (five) times daily. 5 times daily for nerve pain     HYDROcodone-acetaminophen 5-325 MG per tablet  Commonly known as:  NORCO/VICODIN  Take 1 tab every 4-6 hours as needed for pain.     lactose free nutrition Liqd  Take 237 mLs by mouth 3 (three) times daily with meals.     oxybutynin 5 MG tablet  Commonly known as:  DITROPAN  Take 1 tablet (5 mg total) by mouth every 6 (six) hours as needed for bladder spasms.     phenazopyridine 100 MG tablet  Commonly known as:  PYRIDIUM  Take 1 tablet (100 mg total) by mouth every 8 (eight) hours as needed for pain (Burning urination.  Will turn urine and body fluids orange.).     rifaximin 550 MG Tabs tablet  Commonly known as:  XIFAXAN  Take 550 mg by mouth 2 (two) times daily.     sildenafil 100 MG tablet  Commonly known as:  VIAGRA  Take 50 mg by mouth daily as needed for erectile dysfunction.     tamsulosin 0.4 MG Caps capsule  Commonly known as:  FLOMAX  Take 1 capsule (0.4 mg total) by mouth daily.     Testosterone 20.25 MG/ACT (1.62%) Gel  Place 2 application onto the skin daily. Pt uses 2 pumps     zolpidem 10 MG tablet  Commonly known as:  AMBIEN  Take 5 mg by mouth at bedtime as needed. sleep        Disposition and follow-up:   Daniel Huynh was discharged from [facility] in Stable condition.  At the hospital follow up visit please address:  1.  Pain control  2. Hypercalcemia  2.  Labs / imaging needed at time of follow-up: CBC, CMP  3.  Pending labs/ test needing follow-up: None  Follow-up Appointments: Follow-up Information   Follow up with [facility], MD On 05/01/2014. (at 10:30 am  (Cancer Doctor))    Specialty:  Oncology   Contact information:   [address] [place] [id number] [phone]  Discharge Instructions: Discharge Instructions   Call MD for:  severe uncontrolled pain    Complete by:  As directed      Diet - low sodium heart healthy    Complete by:  As directed      Discharge instructions    Complete by:  As directed   Please Stop taking Immodium (the pain pills may constipate you).  Make sure to keep you follow up appointment with Dr. Alen Blew     Increase activity slowly    Complete by:  As directed            Consultations:    Procedures Performed:  Dg Chest 2 View  04/26/2014   CLINICAL DATA:  Left-sided chest pain. Shortness of breath. Current history of metastatic disease of as yet unknown primary.  EXAM: CHEST  2 VIEW  COMPARISON:  PET-CT 04/20/2014.  Two-view chest x-ray 06/05/2013.  FINDINGS: Interval development of small bilateral pleural effusions since the PET-CT 6 days ago. Cardiac silhouette normal in size. Thoracic aorta minimally atherosclerotic. Hilar and  mediastinal contours otherwise unremarkable. Lungs emphysematous though clear. 3 mm nodule in the inferior right upper lobe identified on PET-CT not visible on the chest x-ray. No pneumothorax. Mild degenerative changes involving the thoracic spine. Lytic lesions involving multiple thoracic vertebrae, better seen on the PET-CT.  IMPRESSION: 1. Small bilateral pleural effusions, new since the PET-CT 6 days ago. 2. No acute cardiopulmonary disease otherwise. 3. Osseous metastatic disease, better visualized on the PET-CT.   Electronically Signed   By: Evangeline Dakin M.D.   On: 04/26/2014 10:29   Ct Angio Chest Pe W/cm &/or Wo Cm  04/26/2014   CLINICAL DATA:  Metastatic disease unknown primary. Weight loss and chest pain. Rule out pulmonary embolism.  EXAM: CT ANGIOGRAPHY CHEST WITH CONTRAST  TECHNIQUE: Multidetector CT imaging of the chest was performed using the standard protocol during bolus administration of intravenous contrast. Multiplanar CT image reconstructions and MIPs were obtained to evaluate the vascular anatomy.  CONTRAST:  87mL OMNIPAQUE IOHEXOL 350 MG/ML SOLN  COMPARISON:  CT abdomen 01/26/2014.  PET-CT 04/20/2014  FINDINGS: Negative for pulmonary embolism. Negative for aortic aneurysm or dissection. Mild coronary artery calcification. Heart size within normal limits.  3 mm right upper lobe nodule anterior to the major fissure on axial image 55. This is unchanged from the prior PET-CT and is indeterminate. No additional nodules. Negative for pneumonia or effusion. No mediastinal adenopathy.  Extensive metastatic disease throughout the entire thoracic spine. Mild pathologic fracture of T5. If the patient has acute neurologic symptoms, consider MRI to evaluate the thoracic spinal cord and spinal canal.  Moderate ascites is present around the liver possibly due to malignancy. Small pericardial effusion.  Review of the MIP images confirms the above findings.  IMPRESSION: Negative for pulmonary embolism.   Widespread bony metastatic disease throughout the thoracic spine with a pathologic mild compression fracture of T5.  3 mm right upper lobe nodule, possibly metastatic disease however this could also be benign.  Moderate ascites.  Small pericardial effusion.   Electronically Signed   By: Franchot Gallo M.D.   On: 04/26/2014 12:10   Mr 3d Recon At Scanner  04/04/2014   CLINICAL DATA:  Common duct dilatation. Abdominal pain with nausea. Evaluate for common duct stones. History of gallstones. Gastroesophageal reflux disease.  EXAM: MRI ABDOMEN WITHOUT AND WITH CONTRAST (INCLUDING MRCP)  TECHNIQUE: Multiplanar multisequence MR imaging of the abdomen was performed both before and after the administration of intravenous  contrast. Heavily T2-weighted images of the biliary and pancreatic ducts were obtained, and three-dimensional MRCP images were rendered by post processing.  CONTRAST:  86mL MULTIHANCE GADOBENATE DIMEGLUMINE 529 MG/ML IV SOLN  COMPARISON:  01/26/2014 and 07/13/2012.  FINDINGS: Normal heart size without pericardial or pleural effusion. Suspect left cardiophrenic angle adenopathy on image 26/series 13002. New left perivertebral/retrocrural adenopathy, at 1.0 cm on image 24/ series 1302.  Normal liver, without intrahepatic biliary ductal dilatation. Patent hepatic and portal veins.  Normal spleen.  Surgical clips at the gastroesophageal junction.  Improved appearance of the gallbladder. Probable sludge, without acute cholecystitis.  The pancreatic duct is mildly dilated, including at 6 mm on image 21/series 4. This is followed to the level of the ampulla.  The common duct is tortuous. Measures 8 mm in the region of the porta hepatis. Undergoes an area of apparent narrowing just above the pancreatic head. Favored to be due to tortuosity. Example image 35/series 4. The more distal common duct is mildly dilated for age at 1.0 cm on image 23/series 4. Also followed to the level of the ampulla, without evidence  of choledocholithiasis.  Normal left kidney. Mild right sided hydronephrosis and hydroureter. No cause identified.  Development of retroperitoneal adenopathy. An index left periaortic node measures 1.6 cm on image 69/series 13002. New small volume ascites.  Innumerable foci of abnormal T2 hyperintensity and hyper enhancement throughout the thoracolumbar spine. Example image 10/series 11 and image 39/series 14.  IMPRESSION: 1. Findings suspicious for metastatic disease, including new thoracoabdominal adenopathy and bone lesions. No definite primary identified. Consider oncology consultation and possible PET. These results will be called to the ordering clinician or representative by the Radiologist Assistant, and communication documented in the PACS or zVision Dashboard. 2. Common duct and pancreatic duct dilatation, without obstructive stone or mass identified. Otherwise occult ampullary lesion or ampullary stenosis cannot be excluded. Depending on the oncology workup, ERCP should be considered. 3. Resolution of left-sided hydronephrosis since 01/26/2014. Mild right-sided hydronephrosis is without definite cause. 4. New small volume ascites.   Electronically Signed   By: Abigail Miyamoto M.D.   On: 04/04/2014 12:32   Nm Pet Image Initial (pi) Skull Base To Thigh  04/20/2014   CLINICAL DATA:  Initial treatment strategy for lymphadenopathy.  EXAM: NUCLEAR MEDICINE PET SKULL BASE TO THIGH  TECHNIQUE: 8.5 mCi F-18 FDG was injected intravenously. Full-ring PET imaging was performed from the skull base to thigh after the radiotracer. CT data was obtained and used for attenuation correction and anatomic localization.  FASTING BLOOD GLUCOSE:  Value:  124  mg/dl  COMPARISON:  MRI 04/04/2014, CT 01/26/2014  FINDINGS: NECK  No hypermetabolic lymph nodes in the neck  CHEST  No hypermetabolic mediastinal lymph nodes. There is a small 3 mm pulmonary nodule in the right upper lobe (image 37, series 8.  ABDOMEN/PELVIS  There are two  hypermetabolic lymph nodes within the porta hepatis. These are difficult to define on the non contrast CT. One is posterior to the pancreatic head measuring approximately 11 mm (image 137 ) with SUV max = 5.9. The second is superior to the pancreas (image 132) with SUV max equal 4.5. There is no discrete hypermetabolic abnormal activity within the pancreas.  There is no abnormal hypermetabolic activity within the liver. There is ascites in the upper abdomen. There are no more distal hypermetabolic lymph nodes in the pelvis.  SKELETON  There is widespread skeletal metastasis. There are multiple sites of abnormal hypermetabolic activity within the pelvis,  spine, and proximal femurs and humeri. These foci of abnormal hypermetabolic back tibia or associated with sclerotic lesions. For example in the right aspect of the L5 vertebral body there is a 3 cm lytic lesion with SUV max equal 10.6. Within the right humeral head there is a 1.6 cm lesion with SUV max equal 7.6. Metastatic lesions involve nearly all the vertebral bodies.  IMPRESSION: 1. Hypermetabolic periportal /peripancreatic lymphadenopathy is consistent with metastatic adenopathy. No discrete hypermetabolic activity is noted within the pancreas but concern exists for pancreatic neoplasm. 2. No evidence of liver metastasis. 3. Small 3 mm right middle lobe pulmonary nodule. Recommend attention on follow-up 4. 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   Electronically Signed   By: Suzy Bouchard M.D.   On: 04/20/2014 09:15   Mr Jeananne Rama W/wo Cm/mrcp  04/04/2014   CLINICAL DATA:  Common duct dilatation. Abdominal pain with nausea. Evaluate for common duct stones. History of gallstones. Gastroesophageal reflux disease.  EXAM: MRI ABDOMEN WITHOUT AND WITH CONTRAST (INCLUDING MRCP)  TECHNIQUE: Multiplanar multisequence MR imaging of the abdomen was performed both before and after the administration  of intravenous contrast. Heavily T2-weighted images of the biliary and pancreatic ducts were obtained, and three-dimensional MRCP images were rendered by post processing.  CONTRAST:  50mL MULTIHANCE GADOBENATE DIMEGLUMINE 529 MG/ML IV SOLN  COMPARISON:  01/26/2014 and 07/13/2012.  FINDINGS: Normal heart size without pericardial or pleural effusion. Suspect left cardiophrenic angle adenopathy on image 26/series 13002. New left perivertebral/retrocrural adenopathy, at 1.0 cm on image 24/ series 1302.  Normal liver, without intrahepatic biliary ductal dilatation. Patent hepatic and portal veins.  Normal spleen.  Surgical clips at the gastroesophageal junction.  Improved appearance of the gallbladder. Probable sludge, without acute cholecystitis.  The pancreatic duct is mildly dilated, including at 6 mm on image 21/series 4. This is followed to the level of the ampulla.  The common duct is tortuous. Measures 8 mm in the region of the porta hepatis. Undergoes an area of apparent narrowing just above the pancreatic head. Favored to be due to tortuosity. Example image 35/series 4. The more distal common duct is mildly dilated for age at 1.0 cm on image 23/series 4. Also followed to the level of the ampulla, without evidence of choledocholithiasis.  Normal left kidney. Mild right sided hydronephrosis and hydroureter. No cause identified.  Development of retroperitoneal adenopathy. An index left periaortic node measures 1.6 cm on image 69/series 13002. New small volume ascites.  Innumerable foci of abnormal T2 hyperintensity and hyper enhancement throughout the thoracolumbar spine. Example image 10/series 11 and image 39/series 14.  IMPRESSION: 1. Findings suspicious for metastatic disease, including new thoracoabdominal adenopathy and bone lesions. No definite primary identified. Consider oncology consultation and possible PET. These results will be called to the ordering clinician or representative by the Radiologist  Assistant, and communication documented in the PACS or zVision Dashboard. 2. Common duct and pancreatic duct dilatation, without obstructive stone or mass identified. Otherwise occult ampullary lesion or ampullary stenosis cannot be excluded. Depending on the oncology workup, ERCP should be considered. 3. Resolution of left-sided hydronephrosis since 01/26/2014. Mild right-sided hydronephrosis is without definite cause. 4. New small volume ascites.   Electronically Signed   By: Abigail Miyamoto M.D.   On: 04/04/2014 12:32   Admission HPI: Daniel Huynh is a 73 yo AA male with PMH of newly diagnosed Metastatic cancer (unknown primary), BPH, Anxiety, and weight loss. He reports that the pain is  located over his left anterior chest, he notes that it is worse with certain movements. He notes that he first appreciated this specific pain 3 weeks to a month ago after a golf outing, initially he thought this was a muscle strain and would go away. However last night the pain became worse and was not relieved in the morning so he went to Urgent Care for further evaluation. He does report the pain is relieved with Norco. He denies any SOB, nausea, cough, fever, chills. No recent leg swelling or prolonged travel.    Hospital Course by problem list:   Atypical chest pain - Patient presented with atypical chest pain, he was ruled out for a PE, Aortic Dissection with a CTA, he was ruled out for ACS with serial cardiac enzymes.  His pain appears to be most consistent with MSK pain from known bone metastasis.  His pain was controlled with his home dose of Norco and he did not require morphine for breakthrough pain.  He was discharged home, with close follow up by oncology (6/16).    Diarrhea - Patient has chronic diarrhea, he was continued on his home rifaximin however his Imodium was held.  He reports no BM in 2-3 days, this is likely due to his narcotic pain medications.  He was encouraged to discontinue Imodium on  discharge.    Cancer, metastatic to bone  - Patient with recent diagnosis of Metastatic cancer with unknown primary.  He has an appointment to establish with Dr. Alen Blew in 4 days. He will have an endoscopy with U/S biopsy with Dr. Ardis Hughs in 1 week.  He was instructed of importance of follow up with Dr. Alen Blew.    Protein-calorie malnutrition, severe - Patient evaluated by nutrition, he was started on feeding supplements TID, his was given a 1 month Rx for this.    Hypercalcemia of malignancy -Patient has new hypercalcemia not present on lab work 1 month ago.  He does not appear to be acutely symptomatic. PTH is appropriately suppressed.  I discussed this with Dr. Alen Blew who felt without treatment this may progress even before he is seen on Tuesday.  He was treated inpatient with pamidronate.  Dr. Alen Blew will follow this up at his appointment on the 16th.    Discharge Vitals:   BP 123/79  Pulse 81  Temp(Src) 98.6 F (37 C) (Oral)  Resp 16  Ht 5\' 11"  (1.803 m)  Wt 126 lb 4.8 oz (57.289 kg)  BMI 17.62 kg/m2  SpO2 97%  Discharge Labs:  Results for orders placed during the hospital encounter of 04/26/14 (from the past 24 hour(s))  TROPONIN I     Status: None   Collection Time    04/26/14  9:40 PM      Result Value Ref Range   Troponin I <0.30  <0.30 ng/mL  TROPONIN I     Status: None   Collection Time    04/27/14  3:46 AM      Result Value Ref Range   Troponin I <0.30  <0.30 ng/mL  BASIC METABOLIC PANEL     Status: Abnormal   Collection Time    04/27/14  4:00 AM      Result Value Ref Range   Sodium 141  137 - 147 mEq/L   Potassium 3.7  3.7 - 5.3 mEq/L   Chloride 99  96 - 112 mEq/L   CO2 29  19 - 32 mEq/L   Glucose, Bld 98  70 - 99 mg/dL  BUN 18  6 - 23 mg/dL   Creatinine, Ser 1.45 (*) 0.50 - 1.35 mg/dL   Calcium 12.0 (*) 8.4 - 10.5 mg/dL   GFR calc non Af Amer 47 (*) >90 mL/min   GFR calc Af Amer 54 (*) >90 mL/min    Signed: Joni Reining, DO 04/27/2014, 5:54 PM    Time Spent on Discharge: 45 minutes Services Ordered on Discharge: none Equipment Ordered on Discharge: none

## 2014-04-27 NOTE — Progress Notes (Signed)
Chest pain rounds.  History reviewed.  Patient seen.  His pain is worse if he twists his upper torso. He has history of metastatic cancer metastatic to bone. Physical exam reveals clear lungs.  Cardiac exam reveals no pericardial rub. His electrocardiogram is nonacute.  No ischemic changes to His cardiac enzymes are normal. Impression: Noncardiac chest pain, probably secondary to bony metastasis to vertebral column Plan: No indication for further inpatient cardiac tests such as stress test at this time.  Continue to focus on pain control.

## 2014-04-27 NOTE — H&P (Signed)
  Date: 04/27/2014  Patient name: Daniel Huynh  Medical record number: [id number]  Date of birth: [birth year]   I have seen and evaluated Daniel Huynh and discussed their care with the Residency Team. Daniel Huynh was admitted with atypical CP and has R/O for AMI with Trop I and EKG. He had a CTA which R/O PE, eso dissection, aortic dissection, and pericardial tamponade. He cont to have pain which is controlled with his home hydrocodone.  He has been seen by GI for chronic D and weight loss. Initial working dx was dumping syndrome or SBBO (had Billroth I gastrojejunostomy so either is possible) and initially tx with Xifaxan. CT scan was also planned. He was dx with metastatic cancer of unknown primary and EUS is sch next week along with an Onc appt.   He currently has no D but has been without a BM for 3 days.  Vitals reviewed. HRRR. LCTAB. ABD + BS. Scar present. Ext no edema.    Labs sig for elevated Ca.  Assessment and Plan: I have seen and evaluated the patient as outlined above. I agree with the formulated Assessment and Plan as detailed in the residents' admission note, with the following changes:   1. Non cardiac CP - presumed to be a sequale of his mets to bone. Will focus on pain control with home hydrocodone. He has GI and Onc F/U.   2. Hypercalemia - will start pamidronate. F/U Onc.  3. Metastatic cancer - pain control and outpt W/U.  4. Severe protein calorie malnutrition  Daniel Crews, MD 6/12/20153:07 PM

## 2014-04-27 NOTE — Discharge Instructions (Signed)
Please stop taking Immodium (the pain pills may constipate you) Continue to take your hydrocodone pain medication as needed for pain. It is important to make it to you appointment with Dr. Alen Blew on the 16th.   Bone Metastases Cancerous growths can begin in any part of the body. The original site of cancer is called the primary tumor or primary cancer (for example, breast cancer). After cancer has developed in one area of the body, cancerous cells from that area can break away and travel through the body's bloodstream. If these cancerous cells begin growing in another place in the body, they are called metastases. Bone metastases are cancer cells that have spread to the bone (which is different from a cancer that starts in the bone). These secondary growths are like the original tumor. For example, if a prostate cancer spreads to bone it is called metastatic prostate cancer, or prostate cancer metastatic to bone, but not bone cancer. Cancers can spread to almost any bone; the spine and pelvis are often involved.  Any type of cancer can spread to the bone, but the most common are breast, lung, kidney, thyroid and prostate cancers. Sometimes the primary tumor is not discovered until there are bone problems. If the primary cancer location cannot be discovered, the cancer is called cancer of unknown primary location. SYMPTOMS  Pain in the bones is the main symptom of bone metastases. Some other problems may occur first including:  Decreased appetite.  Nausea.  Muscle weakness.  Confusion.  Unusual sleep patterns due to discomfort.  Overly tired (fatigue).  Restlessness. Frail or brittle bones may lead to broken bones (fractures) that lead to learning what is wrong (diagnosis). A tumor often weakens the bones.  DIAGNOSIS  Metastatic cancers may be found months or years after or at the same time as the primary tumor. When a second tumor is found in a patient who has been treated for cancer, it  is more often a metastasis than another primary tumor.  The patient's symptoms, physical examination, X-rays and blood tests may suggest a bone metastases. In addition, an examination of tissue or a cell sample (biopsy) is usually done to find the cancer. This sample is removed with a needle. This tissue sample must be looked at under a microscope to confirm a diagnosis. TREATMENT  Options generally include treatments that give relief from symptoms (palliative) or curative. Those with advanced, metastasized cancer may receive treatment focused on pain relief and prolonging life. These treatments depend on the type of cancer and its location.  Treatment for cancer depends on its type and location. Some of these treatments are:  Surgery to remove the original tumor and/or to remove parts of the body that produce hormones and other chemicals that make cancer worse.  Treatment with drugs (chemotherapy).  Bone marrow transplantations on rare occasions.  Radiation therapy (radiotherapy).  Hormonal therapy.  Pain relieving medications. Your caregiver will help you understand the likelihood that any particular treatment will be helpful for you. While some treatments aim to cure or control the cancer, others give relief from symptoms only. If you have bone metastases, radiation therapy may be recommended to treat pain (if it is in one main location). Pain medications are available. These include strong medicines like morphine. You may be instructed to take a long-acting pain medication (to control most of your pain) and a short-acting medication to control occasional flares of pain. Pain medication is sometimes also given continuously through a pump. HOME CARE INSTRUCTIONS  Take medications exactly as prescribed.  Keep any follow-up appointments.  Pain medications can make you sleepy or confused. Do not drive, climb ladders, or do other dangerous activities while on pain medication.  Pain  medications often cause constipation. Ask your caregiver for information on stool softeners.  Do not share your pain medication with others. SEEK MEDICAL CARE IF:   Your bone pain is not controlled.  You are having problems or side effects from your medication.  You have excessive sleepiness or confusion. SEEK IMMEDIATE MEDICAL CARE IF:   You fall and have any injury or pain from the fall.  You have trouble walking.  You have numbness or tingling in your legs.  You develop a sudden significant worsening of your pain. Document Released: 10/23/2002 Document Revised: 01/25/2012 Document Reviewed: 06/15/2008 [facility] Patient Information 2014 [place].    Chest Pain (Nonspecific) Chest pain has many causes. Your pain could be caused by something serious, such as a heart attack or a blood clot in the lungs. It could also be caused by something less serious, such as a chest bruise or a virus. Follow up with your doctor. More lab tests or other studies may be needed to find the cause of your pain. Most of the time, nonspecific chest pain will improve within 2 to 3 days of rest and mild pain medicine. HOME CARE  For chest bruises, you may put ice on the sore area for 15-20 minutes, 03-04 times a day. Do this only if it makes you feel better.  Put ice in a plastic bag.  Place a towel between the skin and the bag.  Rest for the next 2 to 3 days.  Go back to work if the pain improves.  See your doctor if the pain lasts longer than 1 to 2 weeks.  Only take medicine as told by your doctor.  Quit smoking if you smoke. GET HELP RIGHT AWAY IF:   There is more pain or pain that spreads to the arm, neck, jaw, back, or belly (abdomen).  You have shortness of breath.  You cough more than usual or cough up blood.  You have very bad back or belly pain, feel sick to your stomach (nauseous), or throw up (vomit).  You have very bad weakness.  You pass out (faint).  You have a  fever. Any of these problems may be serious and may be an emergency. Do not wait to see if the problems will go away. Get medical help right away. Call your local emergency services 911 in [place] Do not drive yourself to the hospital. MAKE SURE YOU:   Understand these instructions.  Will watch this condition.  Will get help right away if you or your child is not doing well or gets worse. Document Released: 04/20/2008 Document Revised: 01/25/2012 Document Reviewed: 04/20/2008 [facility] Patient Information 2014 [place], [place].

## 2014-04-30 ENCOUNTER — Encounter (HOSPITAL_COMMUNITY): Payer: Self-pay | Admitting: Pharmacy Technician

## 2014-05-01 ENCOUNTER — Encounter: Payer: Self-pay | Admitting: Oncology

## 2014-05-01 ENCOUNTER — Telehealth: Payer: Self-pay | Admitting: Oncology

## 2014-05-01 ENCOUNTER — Ambulatory Visit (HOSPITAL_BASED_OUTPATIENT_CLINIC_OR_DEPARTMENT_OTHER): Payer: Medicare Other | Admitting: Oncology

## 2014-05-01 ENCOUNTER — Ambulatory Visit: Payer: Medicare Other

## 2014-05-01 ENCOUNTER — Other Ambulatory Visit (HOSPITAL_BASED_OUTPATIENT_CLINIC_OR_DEPARTMENT_OTHER): Payer: Medicare Other

## 2014-05-01 ENCOUNTER — Encounter (HOSPITAL_COMMUNITY): Payer: Self-pay | Admitting: *Deleted

## 2014-05-01 VITALS — BP 119/75 | HR 66 | Temp 97.9°F | Resp 18 | Ht 71.0 in | Wt 127.1 lb

## 2014-05-01 DIAGNOSIS — C801 Malignant (primary) neoplasm, unspecified: Secondary | ICD-10-CM

## 2014-05-01 DIAGNOSIS — C7951 Secondary malignant neoplasm of bone: Secondary | ICD-10-CM

## 2014-05-01 DIAGNOSIS — C7952 Secondary malignant neoplasm of bone marrow: Secondary | ICD-10-CM

## 2014-05-01 DIAGNOSIS — R634 Abnormal weight loss: Secondary | ICD-10-CM

## 2014-05-01 LAB — CBC WITH DIFFERENTIAL/PLATELET
BASO%: 0.9 % (ref 0.0–2.0)
BASOS ABS: 0.1 10*3/uL (ref 0.0–0.1)
EOS%: 3.5 % (ref 0.0–7.0)
Eosinophils Absolute: 0.3 10*3/uL (ref 0.0–0.5)
HCT: 40.4 % (ref 38.4–49.9)
HEMOGLOBIN: 13 g/dL (ref 13.0–17.1)
LYMPH#: 0.9 10*3/uL (ref 0.9–3.3)
LYMPH%: 11.1 % — ABNORMAL LOW (ref 14.0–49.0)
MCH: 27.7 pg (ref 27.2–33.4)
MCHC: 32.1 g/dL (ref 32.0–36.0)
MCV: 86.1 fL (ref 79.3–98.0)
MONO#: 0.6 10*3/uL (ref 0.1–0.9)
MONO%: 7.7 % (ref 0.0–14.0)
NEUT#: 6.2 10*3/uL (ref 1.5–6.5)
NEUT%: 76.8 % — ABNORMAL HIGH (ref 39.0–75.0)
Platelets: 208 10*3/uL (ref 140–400)
RBC: 4.69 10*6/uL (ref 4.20–5.82)
RDW: 13.7 % (ref 11.0–14.6)
WBC: 8.1 10*3/uL (ref 4.0–10.3)

## 2014-05-01 LAB — COMPREHENSIVE METABOLIC PANEL (CC13)
ALT: 25 U/L (ref 0–55)
AST: 34 U/L (ref 5–34)
Albumin: 3.5 g/dL (ref 3.5–5.0)
Alkaline Phosphatase: 352 U/L — ABNORMAL HIGH (ref 40–150)
Anion Gap: 11 mEq/L (ref 3–11)
BUN: 18.2 mg/dL (ref 7.0–26.0)
CO2: 27 meq/L (ref 22–29)
Calcium: 10 mg/dL (ref 8.4–10.4)
Chloride: 104 mEq/L (ref 98–109)
Creatinine: 1.2 mg/dL (ref 0.7–1.3)
Glucose: 114 mg/dl (ref 70–140)
Potassium: 3.8 mEq/L (ref 3.5–5.1)
Sodium: 142 mEq/L (ref 136–145)
Total Bilirubin: 0.54 mg/dL (ref 0.20–1.20)
Total Protein: 7.5 g/dL (ref 6.4–8.3)

## 2014-05-01 MED ORDER — MEGESTROL ACETATE 400 MG/10ML PO SUSP: 400.0000 mg | Freq: Two times a day (BID) | ORAL | Status: DC

## 2014-05-01 NOTE — Progress Notes (Signed)
Please see consult note.  

## 2014-05-01 NOTE — Consult Note (Signed)
Reason for Referral: Metastatic cancer unknown primary at this point.   HPI: Daniel Huynh is a 73 year old gentleman currently of [place] where he lived the majority of his life. He is currently retired and have also served in [place] war. He gets his routine medical care at the [facility] clinic in [place]. He has a history of diverticulosis and anxiety and was feeling well until about 2 months ago. He started developing abdominal pain and distention and early satiety. He also started developing bloating symptoms and developed about 30 pound weight loss. He was evaluated by gastroenterology and underwent a MR of the abdomen on 04/04/2014 for the evaluation for possible common bile duct stones. The imaging studies revealed increase adenopathy in the thoracic and abdominal area suspicious for malignancy.Common bile duct and pancreatic duct dilation was noted. No definitive masses or lesions or stones noted. A PET scan was obtained on 04/20/2014 which showed hypermetabolic periportal and peripancreatic lymphadenopathy. Widespread skeletal disease was also noted. Patient was hospitalized briefly on 04/26/2014 for chest pain and cardiac etiology ruled out. He also had a CT scan of the chest to rule out pulmonary emboli. He was also noted to be hypercalcemic and was treated by pamidronate for a total of 90 mg given on 04/27/2014. He is referred to me for the evaluation his new cancer diagnosis.  Clinically, he continues to do poorly. He is noticing decline in his performance status and activity level. He spending more time in a chair in sleeping. He does report symptoms of abdominal distention and bloating. He is no longer reporting any chest pain. He does not report any hip pain or back pain or any bone pain at this time. He continues to eat poorly and continues to lose weight. He has used Ensure but cause him diarrhea. He does not report any headaches blurred vision or double vision. Did not report any syncope  but does reports lethargy at times. Has not reported any alteration of mental status or confusion. He does not report any fevers or chills or sweats but does report weight loss. He does not report any shortness of breath cough or hemoptysis. Does not report any palpitation orthopnea or leg edema. He did report chest pain that have resolved now. Does not report any nausea or vomiting does report abdominal distention. Does not report any constipation or hematochezia or melena. Does not report any frequency urgency or hesitancy. Does not report any lymphadenopathy or petechiae. Does report any skin rashes or lesions. Wrist review of systems unremarkable.   Past Medical History  Diagnosis Date  . Depression   . Anxiety   . Unintentional weight loss   . Diarrhea   . Difficulty urinating   . Arthritis   . Diverticulitis of colon   . Intrinsic asthma     mild   . GERD (gastroesophageal reflux disease)   . Generalized headaches     "monthly" (04/26/2014)  . Metastasis     unknown type/notes 04/26/2014  :  Past Surgical History  Procedure Laterality Date  . Laparoscopic gastrotomy w/ repair of ulcer  1979  . Cystoscopy with retrograde pyelogram, ureteroscopy and stent placement Left 01/30/2014    Procedure: CYSTOSCOPY WITH RETROGRADE PYELOGRAM, URETEROSCOPY AND STENT PLACEMENT;  Surgeon: Molli Hazard, MD;  Location: [facility];  Service: Urology;  Laterality: Left;  . Holmium laser application Left 4/81/8563    Procedure: HOLMIUM LASER APPLICATION;  Surgeon: Molli Hazard, MD;  Location: [facility];  Service: Urology;  Laterality: Left;  . Tonsillectomy      "as a kid"  :   Current Outpatient Prescriptions  Medication Sig Dispense Refill  . dicyclomine (BENTYL) 10 MG capsule Take 1 tab twice daily as needed for spasms and cramping, diarrhea.  60 capsule  1  . doxepin (SINEQUAN) 50 MG capsule Take 50 mg by mouth at bedtime as needed (Sleep).        . famotidine (PEPCID) 20 MG tablet Take 20 mg by mouth 2 (two) times daily as needed for heartburn.       . gabapentin (NEURONTIN) 100 MG capsule Take 100 mg by mouth 4 (four) times daily as needed (Pain). 5 times daily for nerve pain      . HYDROcodone-acetaminophen (NORCO/VICODIN) 5-325 MG per tablet Take 1 tab every 4-6 hours as needed for pain.  50 tablet  0  . lactose free nutrition (BOOST PLUS) LIQD Take 237 mLs by mouth 3 (three) times daily with meals.  90 Can  0  . megestrol (MEGACE) 400 MG/10ML suspension Take 10 mLs (400 mg total) by mouth 2 (two) times daily.  240 mL  0  . oxybutynin (DITROPAN) 5 MG tablet Take 1 tablet (5 mg total) by mouth every 6 (six) hours as needed for bladder spasms.  40 tablet  4  . phenazopyridine (PYRIDIUM) 100 MG tablet Take 1 tablet (100 mg total) by mouth every 8 (eight) hours as needed for pain (Burning urination.  Will turn urine and body fluids orange.).  30 tablet  3  . rifaximin (XIFAXAN) 550 MG TABS tablet Take 550 mg by mouth 2 (two) times daily as needed (IBS).       Marland Kitchen sildenafil (VIAGRA) 100 MG tablet Take 50 mg by mouth daily as needed for erectile dysfunction.       . tamsulosin (FLOMAX) 0.4 MG CAPS capsule Take 1 capsule (0.4 mg total) by mouth daily.  30 capsule  0  . Testosterone 20.25 MG/ACT (1.62%) GEL Place 2 application onto the skin daily as needed (Rash). Pt uses 2 pumps      . zolpidem (AMBIEN) 10 MG tablet Take 5 mg by mouth at bedtime as needed. sleep       No current facility-administered medications for this visit.      Allergies  Allergen Reactions  . Tylenol With Codeine #3 [Acetaminophen-Codeine] Rash  :  Family History  Problem Relation Age of Onset  . Heart disease Mother   . Cancer Sister     unknown type  :  History   Social History  . Marital Status: Married    Spouse Name: N/A    Number of Children: N/A  . Years of Education: N/A   Occupational History  . Not on file.   Social History Main Topics   . Smoking status: Former Smoker -- 0.25 packs/day for 10 years    Types: Cigarettes    Quit date: 11/16/1970  . Smokeless tobacco: Never Used  . Alcohol Use: 0.6 oz/week    1 Glasses of wine per week  . Drug Use: No  . Sexual Activity: Yes   Other Topics Concern  . Not on file   Social History Narrative  . No narrative on file  :  Pertinent items are noted in HPI.  Exam: Blood pressure 119/75, pulse 66, temperature 97.9 F (36.6 C), temperature source Oral, resp. rate 18, height 5\' 11"  (1.803 m), weight 127 lb 1.6 oz (57.652 kg). General appearance: alert, cooperative and  cachectic Head: Normocephalic, without obvious abnormality Throat: lips, mucosa, and tongue normal; teeth and gums normal Neck: no adenopathy Back: negative Resp: clear to auscultation bilaterally Chest wall: no tenderness Cardio: regular rate and rhythm, S1, S2 normal, no murmur, click, rub or gallop GI: soft, non-tender; bowel sounds normal; no masses,  no organomegaly Extremities: extremities normal, atraumatic, no cyanosis or edema Pulses: 2+ and symmetric Skin: Skin color, texture, turgor normal. No rashes or lesions Lymph nodes: Cervical, supraclavicular, and axillary nodes normal. Neurologic: Grossly normal   Recent Labs  05/01/14 1038  WBC 8.1  HGB 13.0  HCT 40.4  PLT 208    Recent Labs  05/01/14 1038  NA 142  K 3.8  CO2 27  GLUCOSE 114  BUN 18.2  CREATININE 1.2  CALCIUM 10.0     Ct Angio Chest Pe W/cm &/or Wo Cm  04/26/2014   CLINICAL DATA:  Metastatic disease unknown primary. Weight loss and chest pain. Rule out pulmonary embolism.  EXAM: CT ANGIOGRAPHY CHEST WITH CONTRAST  TECHNIQUE: Multidetector CT imaging of the chest was performed using the standard protocol during bolus administration of intravenous contrast. Multiplanar CT image reconstructions and MIPs were obtained to evaluate the vascular anatomy.  CONTRAST:  92mL OMNIPAQUE IOHEXOL 350 MG/ML SOLN  COMPARISON:  CT  abdomen 01/26/2014.  PET-CT 04/20/2014  FINDINGS: Negative for pulmonary embolism. Negative for aortic aneurysm or dissection. Mild coronary artery calcification. Heart size within normal limits.  3 mm right upper lobe nodule anterior to the major fissure on axial image 55. This is unchanged from the prior PET-CT and is indeterminate. No additional nodules. Negative for pneumonia or effusion. No mediastinal adenopathy.  Extensive metastatic disease throughout the entire thoracic spine. Mild pathologic fracture of T5. If the patient has acute neurologic symptoms, consider MRI to evaluate the thoracic spinal cord and spinal canal.  Moderate ascites is present around the liver possibly due to malignancy. Small pericardial effusion.  Review of the MIP images confirms the above findings.  IMPRESSION: Negative for pulmonary embolism.  Widespread bony metastatic disease throughout the thoracic spine with a pathologic mild compression fracture of T5.  3 mm right upper lobe nodule, possibly metastatic disease however this could also be benign.  Moderate ascites.  Small pericardial effusion.   Electronically Signed   By: Franchot Gallo M.D.   On: 04/26/2014 12:10   Mr 3d Recon At Scanner  04/04/2014   CLINICAL DATA:  Common duct dilatation. Abdominal pain with nausea. Evaluate for common duct stones. History of gallstones. Gastroesophageal reflux disease.  EXAM: MRI ABDOMEN WITHOUT AND WITH CONTRAST (INCLUDING MRCP)  TECHNIQUE: Multiplanar multisequence MR imaging of the abdomen was performed both before and after the administration of intravenous contrast. Heavily T2-weighted images of the biliary and pancreatic ducts were obtained, and three-dimensional MRCP images were rendered by post processing.  CONTRAST:  32mL MULTIHANCE GADOBENATE DIMEGLUMINE 529 MG/ML IV SOLN  COMPARISON:  01/26/2014 and 07/13/2012.  FINDINGS: Normal heart size without pericardial or pleural effusion. Suspect left cardiophrenic angle adenopathy on  image 26/series 13002. New left perivertebral/retrocrural adenopathy, at 1.0 cm on image 24/ series 1302.  Normal liver, without intrahepatic biliary ductal dilatation. Patent hepatic and portal veins.  Normal spleen.  Surgical clips at the gastroesophageal junction.  Improved appearance of the gallbladder. Probable sludge, without acute cholecystitis.  The pancreatic duct is mildly dilated, including at 6 mm on image 21/series 4. This is followed to the level of the ampulla.  The common duct is tortuous. Measures  8 mm in the region of the porta hepatis. Undergoes an area of apparent narrowing just above the pancreatic head. Favored to be due to tortuosity. Example image 35/series 4. The more distal common duct is mildly dilated for age at 1.0 cm on image 23/series 4. Also followed to the level of the ampulla, without evidence of choledocholithiasis.  Normal left kidney. Mild right sided hydronephrosis and hydroureter. No cause identified.  Development of retroperitoneal adenopathy. An index left periaortic node measures 1.6 cm on image 69/series 13002. New small volume ascites.  Innumerable foci of abnormal T2 hyperintensity and hyper enhancement throughout the thoracolumbar spine. Example image 10/series 11 and image 39/series 14.  IMPRESSION: 1. Findings suspicious for metastatic disease, including new thoracoabdominal adenopathy and bone lesions. No definite primary identified. Consider oncology consultation and possible PET. These results will be called to the ordering clinician or representative by the Radiologist Assistant, and communication documented in the PACS or zVision Dashboard. 2. Common duct and pancreatic duct dilatation, without obstructive stone or mass identified. Otherwise occult ampullary lesion or ampullary stenosis cannot be excluded. Depending on the oncology workup, ERCP should be considered. 3. Resolution of left-sided hydronephrosis since 01/26/2014. Mild right-sided hydronephrosis is  without definite cause. 4. New small volume ascites.   Electronically Signed   By: Abigail Miyamoto M.D.   On: 04/04/2014 12:32   Nm Pet Image Initial (pi) Skull Base To Thigh  04/20/2014   CLINICAL DATA:  Initial treatment strategy for lymphadenopathy.  EXAM: NUCLEAR MEDICINE PET SKULL BASE TO THIGH  TECHNIQUE: 8.5 mCi F-18 FDG was injected intravenously. Full-ring PET imaging was performed from the skull base to thigh after the radiotracer. CT data was obtained and used for attenuation correction and anatomic localization.  FASTING BLOOD GLUCOSE:  Value:  124  mg/dl  COMPARISON:  MRI 04/04/2014, CT 01/26/2014  FINDINGS: NECK  No hypermetabolic lymph nodes in the neck  CHEST  No hypermetabolic mediastinal lymph nodes. There is a small 3 mm pulmonary nodule in the right upper lobe (image 37, series 8.  ABDOMEN/PELVIS  There are two hypermetabolic lymph nodes within the porta hepatis. These are difficult to define on the non contrast CT. One is posterior to the pancreatic head measuring approximately 11 mm (image 137 ) with SUV max = 5.9. The second is superior to the pancreas (image 132) with SUV max equal 4.5. There is no discrete hypermetabolic abnormal activity within the pancreas.  There is no abnormal hypermetabolic activity within the liver. There is ascites in the upper abdomen. There are no more distal hypermetabolic lymph nodes in the pelvis.  SKELETON  There is widespread skeletal metastasis. There are multiple sites of abnormal hypermetabolic activity within the pelvis, spine, and proximal femurs and humeri. These foci of abnormal hypermetabolic back tibia or associated with sclerotic lesions. For example in the right aspect of the L5 vertebral body there is a 3 cm lytic lesion with SUV max equal 10.6. Within the right humeral head there is a 1.6 cm lesion with SUV max equal 7.6. Metastatic lesions involve nearly all the vertebral bodies.  IMPRESSION: 1. Hypermetabolic periportal /peripancreatic  lymphadenopathy is consistent with metastatic adenopathy. No discrete hypermetabolic activity is noted within the pancreas but concern exists for pancreatic neoplasm. 2. No evidence of liver metastasis. 3. Small 3 mm right middle lobe pulmonary nodule. Recommend attention on follow-up 4. 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   Electronically Signed   By: Suzy Bouchard  M.D.   On: 04/20/2014 09:15    Assessment and Plan:    73 year old gentleman with the following issues:  1. Likely metastatic cancer presenting with lymphadenopathy and sclerotic bony metastasis. He is scheduled to have an endoscopic ultrasound and biopsy on 05/03/2014. The differential diagnosis was discussed with the patient today extensively. This is most likely represents a malignancy and family doubts a benign etiology. Given his GI symptoms, a primary GI cancer is a possibility. These would include esophageal, gastric, pancreatic or bile duct tumors. Certainly squamous cell carcinoma would be a consideration from the genitourinary tract as well. I doubt that this is lymphoma as evidence by the imaging studies. His MR of the abdomen, his chest CT as well as PET CT scan all reviewed with the patient today and based on that the differential is as outlined above. For management standpoint, it is critical to obtain a pathology to determine the histology of this cancer. Once that's time, we can direct treatment.  Most likely this will represent metastatic cancer of unknown primary or a suggest a GI primary. The management of this disease would be merely palliative at this point. This disease have caused quite debilitation to this gentleman and any treatment would be directed towards palliation of symptoms. The goal will be to alleviate pain and early satiety and possibly extends lives. You are remission is unlikely possibility.  The plan will be at this point to  await the results of the biopsy and if it's not diagnostic will have to repeat the biopsy possible a bone biopsy.  2. Hypercalcemia: Calcium repeated today and it is at 10.0. He is status post pamidronate on 04/27/2014. He might benefit from Zometa and 4 weeks.  3. Pain: Seems to be adequately controlled at this time but he might require long acting narcotics or possibly radiation therapy to any sclerotic bony metastasis.  4. Poor nutrition and weight loss: I gave him prescription for Megace to continue to encourage additional supplements.  5. Prognosis: Likely would've very limited life expectancy and I will continue to address this issue with him on subsequent visits.  6. Followup: Shortly after his biopsy to discuss the results and the treatment plan.

## 2014-05-01 NOTE — Progress Notes (Signed)
Checked in new pt with no financial concerns. °

## 2014-05-02 NOTE — ED Provider Notes (Signed)
Medical screening examination/treatment/procedure(s) were performed by a resident physician or non-physician practitioner and as the supervising physician I was immediately available for consultation/collaboration.  Lynne Leader, MD    Gregor Hams, MD 05/02/14 [phone]

## 2014-05-03 ENCOUNTER — Other Ambulatory Visit: Payer: Self-pay | Admitting: Medical Oncology

## 2014-05-03 ENCOUNTER — Encounter (HOSPITAL_COMMUNITY): Payer: Medicare Other | Admitting: Anesthesiology

## 2014-05-03 ENCOUNTER — Ambulatory Visit (HOSPITAL_COMMUNITY)
Admission: RE | Admit: 2014-05-03 | Discharge: 2014-05-03 | Disposition: A | Discharge status: Home / Self Care | Payer: Medicare Other | Source: Ambulatory Visit | Attending: Gastroenterology | Admitting: Gastroenterology

## 2014-05-03 ENCOUNTER — Ambulatory Visit (HOSPITAL_COMMUNITY): Payer: Medicare Other | Admitting: Anesthesiology

## 2014-05-03 ENCOUNTER — Encounter (HOSPITAL_COMMUNITY): Payer: Self-pay | Admitting: *Deleted

## 2014-05-03 ENCOUNTER — Encounter (HOSPITAL_COMMUNITY)
Admission: RE | Disposition: A | Discharge status: Home / Self Care | Payer: Self-pay | Source: Ambulatory Visit | Attending: Gastroenterology

## 2014-05-03 DIAGNOSIS — K589 Irritable bowel syndrome without diarrhea: Secondary | ICD-10-CM | POA: Insufficient documentation

## 2014-05-03 DIAGNOSIS — F411 Generalized anxiety disorder: Secondary | ICD-10-CM | POA: Insufficient documentation

## 2014-05-03 DIAGNOSIS — R933 Abnormal findings on diagnostic imaging of other parts of digestive tract: Secondary | ICD-10-CM

## 2014-05-03 DIAGNOSIS — Z87891 Personal history of nicotine dependence: Secondary | ICD-10-CM | POA: Insufficient documentation

## 2014-05-03 DIAGNOSIS — F329 Major depressive disorder, single episode, unspecified: Secondary | ICD-10-CM | POA: Insufficient documentation

## 2014-05-03 DIAGNOSIS — K219 Gastro-esophageal reflux disease without esophagitis: Secondary | ICD-10-CM | POA: Insufficient documentation

## 2014-05-03 DIAGNOSIS — K8689 Other specified diseases of pancreas: Secondary | ICD-10-CM | POA: Insufficient documentation

## 2014-05-03 DIAGNOSIS — R972 Elevated prostate specific antigen [PSA]: Secondary | ICD-10-CM | POA: Insufficient documentation

## 2014-05-03 DIAGNOSIS — R180 Malignant ascites: Secondary | ICD-10-CM | POA: Insufficient documentation

## 2014-05-03 DIAGNOSIS — Z79899 Other long term (current) drug therapy: Secondary | ICD-10-CM | POA: Insufficient documentation

## 2014-05-03 DIAGNOSIS — C7952 Secondary malignant neoplasm of bone marrow: Principal | ICD-10-CM

## 2014-05-03 DIAGNOSIS — J45909 Unspecified asthma, uncomplicated: Secondary | ICD-10-CM | POA: Insufficient documentation

## 2014-05-03 DIAGNOSIS — K838 Other specified diseases of biliary tract: Secondary | ICD-10-CM | POA: Insufficient documentation

## 2014-05-03 DIAGNOSIS — F3289 Other specified depressive episodes: Secondary | ICD-10-CM | POA: Insufficient documentation

## 2014-05-03 DIAGNOSIS — K831 Obstruction of bile duct: Secondary | ICD-10-CM

## 2014-05-03 DIAGNOSIS — N529 Male erectile dysfunction, unspecified: Secondary | ICD-10-CM | POA: Insufficient documentation

## 2014-05-03 DIAGNOSIS — R197 Diarrhea, unspecified: Secondary | ICD-10-CM | POA: Insufficient documentation

## 2014-05-03 DIAGNOSIS — C7951 Secondary malignant neoplasm of bone: Secondary | ICD-10-CM | POA: Insufficient documentation

## 2014-05-03 HISTORY — DX: Anorexia: R63.0

## 2014-05-03 HISTORY — PX: EUS: SHX5427

## 2014-05-03 SURGERY — UPPER ENDOSCOPIC ULTRASOUND (EUS) LINEAR
Anesthesia: Monitor Anesthesia Care

## 2014-05-03 MED ORDER — LACTATED RINGERS IV SOLN
INTRAVENOUS | Status: DC | PRN
  Administered 2014-05-03: 12:00:00 via INTRAVENOUS

## 2014-05-03 MED ORDER — PROPOFOL INFUSION 10 MG/ML OPTIME
INTRAVENOUS | Status: DC | PRN
  Administered 2014-05-03: 120 ug/kg/min via INTRAVENOUS

## 2014-05-03 MED ORDER — HYDROCODONE-ACETAMINOPHEN 5-325 MG PO TABS: ORAL_TABLET | ORAL | Status: AC

## 2014-05-03 MED ORDER — PROPOFOL 10 MG/ML IV BOLUS
INTRAVENOUS | Status: DC | PRN
  Administered 2014-05-03: 50 mg via INTRAVENOUS

## 2014-05-03 MED ORDER — PROPOFOL 10 MG/ML IV BOLUS
INTRAVENOUS | Status: AC
  Filled 2014-05-03: qty 40

## 2014-05-03 MED ORDER — MEGESTROL ACETATE 400 MG/10ML PO SUSP: 400.0000 mg | Freq: Two times a day (BID) | ORAL | Status: AC

## 2014-05-03 MED ORDER — LIDOCAINE HCL (CARDIAC) 20 MG/ML IV SOLN
INTRAVENOUS | Status: DC | PRN
  Administered 2014-05-03 (×2): 50 mg via INTRAVENOUS

## 2014-05-03 MED ORDER — SODIUM CHLORIDE 0.9 % IV SOLN: INTRAVENOUS | Status: DC

## 2014-05-03 MED ORDER — LIDOCAINE HCL (CARDIAC) 20 MG/ML IV SOLN
INTRAVENOUS | Status: AC
  Filled 2014-05-03: qty 5

## 2014-05-03 NOTE — Op Note (Signed)
[facility] [place], [address]   ENDOSCOPIC ULTRASOUND PROCEDURE REPORT  PATIENT: Daniel Huynh, Daniel Huynh  MR#: [id number] BIRTHDATE: August 03, 1941  GENDER: Male ENDOSCOPIST: Milus Banister, MD REFERRED BY:  Inda Castle, M.D. 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  PSA slightly elevated. MEDICATIONS: MAC sedation, administered by CRNA  DESCRIPTION OF PROCEDURE:   After the risks benefits and alternatives of the procedure were  explained, informed consent was obtained. The patient was then placed in the left, lateral, decubitus postion and IV sedation was administered. Throughout the procedure, the patients blood pressure, pulse and oxygen saturations were monitored continuously.  Under direct visualization, the EUS scope 0383  endoscope was introduced through the mouth  and advanced to the descending duodenum .  Water was used as necessary to provide an acoustic interface.  Upon completion of the imaging, water was removed and the patient was sent to the recovery room in satisfactory condition.    Endoscopic findings (limited views with radial and linear echoendoscope): 1. Normal UGI tract  EUS findings: 1. CBD was dilated to 8.78mm but was otherwise normal, no stones or masses. 2. Main pancreatic duct was slighty dilated (3-28mm in body, tail) but was otherwise normal. 3. No pancreatic masses, tumors 4. There was a small amount of perigastric free fluid, ascites. Using a single transgastric pass with a 22 guage EUS FNA needle, I aspirated 7cc of yellow colored ascitic fluid and sent it to cytology.  Impression: No pancreatic or biliary tumors to explain his metastatic process. There was a small amount of ascites with I aspirated and sent to cytology. This may yield a  diagnosis, cell type.  If not, then will arrange bone biospy with interventional radiology.  _______________________________ eSignedMilus Banister, MD 05/03/2014 12:52 PM   cc: Zola Button, MD

## 2014-05-03 NOTE — Anesthesia Preprocedure Evaluation (Signed)
Anesthesia Evaluation  Patient identified by MRN, date of birth, ID band Patient awake    Reviewed: Allergy & Precautions, H&P , NPO status , Patient's Chart, lab work & pertinent test results  Airway Mallampati: II TM Distance: >3 FB Neck ROM: full    Dental  (+) Edentulous Upper, Missing, Dental Advisory Given Missing all right side lower too:   Pulmonary asthma , former smoker,  Patient not aware that he has any asthma breath sounds clear to auscultation  Pulmonary exam normal       Cardiovascular Exercise Tolerance: Good negative cardio ROS  Rhythm:regular Rate:Normal     Neuro/Psych  Headaches, Anxiety Depression negative neurological ROS  negative psych ROS   GI/Hepatic Neg liver ROS, GERD-  ,  Endo/Other  negative endocrine ROS  Renal/GU negative Renal ROS  negative genitourinary   Musculoskeletal   Abdominal   Peds  Hematology negative hematology ROS (+)   Anesthesia Other Findings   Reproductive/Obstetrics negative OB ROS                           Anesthesia Physical  Anesthesia Plan  ASA: II  Anesthesia Plan: MAC   Post-op Pain Management:    Induction: Intravenous  Airway Management Planned:   Additional Equipment:   Intra-op Plan:   Post-operative Plan:   Informed Consent: I have reviewed the patients History and Physical, chart, labs and discussed the procedure including the risks, benefits and alternatives for the proposed anesthesia with the patient or authorized representative who has indicated his/her understanding and acceptance.   Dental Advisory Given and Dental advisory given  Plan Discussed with: CRNA  Anesthesia Plan Comments:         Anesthesia Quick Evaluation

## 2014-05-03 NOTE — Interval H&P Note (Signed)
History and Physical Interval Note:  05/03/2014 12:05 PM  Daniel Huynh  has presented today for surgery, with the diagnosis of Common bile duct (CBD) obstruction [576.2] Nonspecific (abnormal) findings on radiological and other examination of gastrointestinal tract [793.4]  The various methods of treatment have been discussed with the patient and family. After consideration of risks, benefits and other options for treatment, the patient has consented to  Procedure(s): UPPER ENDOSCOPIC ULTRASOUND (EUS) LINEAR (N/A) as a surgical intervention .  The patient's history has been reviewed, patient examined, no change in status, stable for surgery.  I have reviewed the patient's chart and labs.  Questions were answered to the patient's satisfaction.     Milus Banister

## 2014-05-03 NOTE — Discharge Instructions (Signed)

## 2014-05-03 NOTE — Transfer of Care (Signed)
Immediate Anesthesia Transfer of Care Note  Patient: Daniel Huynh  Procedure(s) Performed: Procedure(s): UPPER ENDOSCOPIC ULTRASOUND (EUS) LINEAR (N/A)  Patient Location: PACU and Endoscopy Unit  Anesthesia Type:MAC  Level of Consciousness: awake, sedated, patient cooperative and responds to stimulation  Airway & Oxygen Therapy: Patient Spontanous Breathing and Patient connected to nasal cannula oxygen  Post-op Assessment: Report given to PACU RN and Post -op Vital signs reviewed and stable  Post vital signs: Reviewed and stable  Complications: No apparent anesthesia complications

## 2014-05-03 NOTE — Telephone Encounter (Signed)
Patient's wife called requesting megace prescription and future prescriptions to be faxed to the [facility] pharmacy @ [phone] (Pt. Doctor is Dr. Vassie Loll there), also requesting for a refill of Norco pain meds. Per MD, ok to refill. Informed spouse that [place] prescription will need to be p.u at our clinic. Spouse expressed understanding, encouraged her to call office with any questions or concerns.  Spouse informed prescription ready for p.u.

## 2014-05-03 NOTE — Anesthesia Postprocedure Evaluation (Signed)
Anesthesia Post Note  Patient: Daniel Huynh  Procedure(s) Performed: Procedure(s) (LRB): UPPER ENDOSCOPIC ULTRASOUND (EUS) LINEAR (N/A)  Anesthesia type: MAC  Patient location: PACU  Post pain: Pain level controlled  Post assessment: Post-op Vital signs reviewed  Last Vitals: BP 160/87  Pulse 66  Temp(Src) 36.8 C  Resp 18  SpO2 100%  Post vital signs: Reviewed  Level of consciousness: awake  Complications: No apparent anesthesia complications

## 2014-05-03 NOTE — H&P (View-Only) (Signed)
Reason for Referral: Metastatic cancer unknown primary at this point.   HPI: Daniel Huynh is a 73 year old gentleman currently of [place] where he lived the majority of his life. He is currently retired and have also served in [place] war. He gets his routine medical care at the [facility] clinic in [place]. He has a history of diverticulosis and anxiety and was feeling well until about 2 months ago. He started developing abdominal pain and distention and early satiety. He also started developing bloating symptoms and developed about 30 pound weight loss. He was evaluated by gastroenterology and underwent a MR of the abdomen on 04/04/2014 for the evaluation for possible common bile duct stones. The imaging studies revealed increase adenopathy in the thoracic and abdominal area suspicious for malignancy.Common bile duct and pancreatic duct dilation was noted. No definitive masses or lesions or stones noted. A PET scan was obtained on 04/20/2014 which showed hypermetabolic periportal and peripancreatic lymphadenopathy. Widespread skeletal disease was also noted. Patient was hospitalized briefly on 04/26/2014 for chest pain and cardiac etiology ruled out. He also had a CT scan of the chest to rule out pulmonary emboli. He was also noted to be hypercalcemic and was treated by pamidronate for a total of 90 mg given on 04/27/2014. He is referred to me for the evaluation his new cancer diagnosis.  Clinically, he continues to do poorly. He is noticing decline in his performance status and activity level. He spending more time in a chair in sleeping. He does report symptoms of abdominal distention and bloating. He is no longer reporting any chest pain. He does not report any hip pain or back pain or any bone pain at this time. He continues to eat poorly and continues to lose weight. He has used Ensure but cause him diarrhea. He does not report any headaches blurred vision or double vision. Did not report any syncope  but does reports lethargy at times. Has not reported any alteration of mental status or confusion. He does not report any fevers or chills or sweats but does report weight loss. He does not report any shortness of breath cough or hemoptysis. Does not report any palpitation orthopnea or leg edema. He did report chest pain that have resolved now. Does not report any nausea or vomiting does report abdominal distention. Does not report any constipation or hematochezia or melena. Does not report any frequency urgency or hesitancy. Does not report any lymphadenopathy or petechiae. Does report any skin rashes or lesions. Wrist review of systems unremarkable.   Past Medical History  Diagnosis Date  . Depression   . Anxiety   . Unintentional weight loss   . Diarrhea   . Difficulty urinating   . Arthritis   . Diverticulitis of colon   . Intrinsic asthma     mild   . GERD (gastroesophageal reflux disease)   . Generalized headaches     "monthly" (04/26/2014)  . Metastasis     unknown type/notes 04/26/2014  :  Past Surgical History  Procedure Laterality Date  . Laparoscopic gastrotomy w/ repair of ulcer  1979  . Cystoscopy with retrograde pyelogram, ureteroscopy and stent placement Left 01/30/2014    Procedure: CYSTOSCOPY WITH RETROGRADE PYELOGRAM, URETEROSCOPY AND STENT PLACEMENT;  Surgeon: Molli Hazard, MD;  Location: [facility];  Service: Urology;  Laterality: Left;  . Holmium laser application Left 12/10/5807    Procedure: HOLMIUM LASER APPLICATION;  Surgeon: Molli Hazard, MD;  Location: [facility];  Service: Urology;  Laterality: Left;  . Tonsillectomy      "as a kid"  :   Current Outpatient Prescriptions  Medication Sig Dispense Refill  . dicyclomine (BENTYL) 10 MG capsule Take 1 tab twice daily as needed for spasms and cramping, diarrhea.  60 capsule  1  . doxepin (SINEQUAN) 50 MG capsule Take 50 mg by mouth at bedtime as needed (Sleep).        . famotidine (PEPCID) 20 MG tablet Take 20 mg by mouth 2 (two) times daily as needed for heartburn.       . gabapentin (NEURONTIN) 100 MG capsule Take 100 mg by mouth 4 (four) times daily as needed (Pain). 5 times daily for nerve pain      . HYDROcodone-acetaminophen (NORCO/VICODIN) 5-325 MG per tablet Take 1 tab every 4-6 hours as needed for pain.  50 tablet  0  . lactose free nutrition (BOOST PLUS) LIQD Take 237 mLs by mouth 3 (three) times daily with meals.  90 Can  0  . megestrol (MEGACE) 400 MG/10ML suspension Take 10 mLs (400 mg total) by mouth 2 (two) times daily.  240 mL  0  . oxybutynin (DITROPAN) 5 MG tablet Take 1 tablet (5 mg total) by mouth every 6 (six) hours as needed for bladder spasms.  40 tablet  4  . phenazopyridine (PYRIDIUM) 100 MG tablet Take 1 tablet (100 mg total) by mouth every 8 (eight) hours as needed for pain (Burning urination.  Will turn urine and body fluids orange.).  30 tablet  3  . rifaximin (XIFAXAN) 550 MG TABS tablet Take 550 mg by mouth 2 (two) times daily as needed (IBS).       Marland Kitchen sildenafil (VIAGRA) 100 MG tablet Take 50 mg by mouth daily as needed for erectile dysfunction.       . tamsulosin (FLOMAX) 0.4 MG CAPS capsule Take 1 capsule (0.4 mg total) by mouth daily.  30 capsule  0  . Testosterone 20.25 MG/ACT (1.62%) GEL Place 2 application onto the skin daily as needed (Rash). Pt uses 2 pumps      . zolpidem (AMBIEN) 10 MG tablet Take 5 mg by mouth at bedtime as needed. sleep       No current facility-administered medications for this visit.      Allergies  Allergen Reactions  . Tylenol With Codeine #3 [Acetaminophen-Codeine] Rash  :  Family History  Problem Relation Age of Onset  . Heart disease Mother   . Cancer Sister     unknown type  :  History   Social History  . Marital Status: Married    Spouse Name: N/A    Number of Children: N/A  . Years of Education: N/A   Occupational History  . Not on file.   Social History Main Topics   . Smoking status: Former Smoker -- 0.25 packs/day for 10 years    Types: Cigarettes    Quit date: 11/16/1970  . Smokeless tobacco: Never Used  . Alcohol Use: 0.6 oz/week    1 Glasses of wine per week  . Drug Use: No  . Sexual Activity: Yes   Other Topics Concern  . Not on file   Social History Narrative  . No narrative on file  :  Pertinent items are noted in HPI.  Exam: Blood pressure 119/75, pulse 66, temperature 97.9 F (36.6 C), temperature source Oral, resp. rate 18, height 5\' 11"  (1.803 m), weight 127 lb 1.6 oz (57.652 kg). General appearance: alert, cooperative and  cachectic Head: Normocephalic, without obvious abnormality Throat: lips, mucosa, and tongue normal; teeth and gums normal Neck: no adenopathy Back: negative Resp: clear to auscultation bilaterally Chest wall: no tenderness Cardio: regular rate and rhythm, S1, S2 normal, no murmur, click, rub or gallop GI: soft, non-tender; bowel sounds normal; no masses,  no organomegaly Extremities: extremities normal, atraumatic, no cyanosis or edema Pulses: 2+ and symmetric Skin: Skin color, texture, turgor normal. No rashes or lesions Lymph nodes: Cervical, supraclavicular, and axillary nodes normal. Neurologic: Grossly normal   Recent Labs  05/01/14 1038  WBC 8.1  HGB 13.0  HCT 40.4  PLT 208    Recent Labs  05/01/14 1038  NA 142  K 3.8  CO2 27  GLUCOSE 114  BUN 18.2  CREATININE 1.2  CALCIUM 10.0     Ct Angio Chest Pe W/cm &/or Wo Cm  04/26/2014   CLINICAL DATA:  Metastatic disease unknown primary. Weight loss and chest pain. Rule out pulmonary embolism.  EXAM: CT ANGIOGRAPHY CHEST WITH CONTRAST  TECHNIQUE: Multidetector CT imaging of the chest was performed using the standard protocol during bolus administration of intravenous contrast. Multiplanar CT image reconstructions and MIPs were obtained to evaluate the vascular anatomy.  CONTRAST:  93mL OMNIPAQUE IOHEXOL 350 MG/ML SOLN  COMPARISON:  CT  abdomen 01/26/2014.  PET-CT 04/20/2014  FINDINGS: Negative for pulmonary embolism. Negative for aortic aneurysm or dissection. Mild coronary artery calcification. Heart size within normal limits.  3 mm right upper lobe nodule anterior to the major fissure on axial image 55. This is unchanged from the prior PET-CT and is indeterminate. No additional nodules. Negative for pneumonia or effusion. No mediastinal adenopathy.  Extensive metastatic disease throughout the entire thoracic spine. Mild pathologic fracture of T5. If the patient has acute neurologic symptoms, consider MRI to evaluate the thoracic spinal cord and spinal canal.  Moderate ascites is present around the liver possibly due to malignancy. Small pericardial effusion.  Review of the MIP images confirms the above findings.  IMPRESSION: Negative for pulmonary embolism.  Widespread bony metastatic disease throughout the thoracic spine with a pathologic mild compression fracture of T5.  3 mm right upper lobe nodule, possibly metastatic disease however this could also be benign.  Moderate ascites.  Small pericardial effusion.   Electronically Signed   By: Franchot Gallo M.D.   On: 04/26/2014 12:10   Mr 3d Recon At Scanner  04/04/2014   CLINICAL DATA:  Common duct dilatation. Abdominal pain with nausea. Evaluate for common duct stones. History of gallstones. Gastroesophageal reflux disease.  EXAM: MRI ABDOMEN WITHOUT AND WITH CONTRAST (INCLUDING MRCP)  TECHNIQUE: Multiplanar multisequence MR imaging of the abdomen was performed both before and after the administration of intravenous contrast. Heavily T2-weighted images of the biliary and pancreatic ducts were obtained, and three-dimensional MRCP images were rendered by post processing.  CONTRAST:  81mL MULTIHANCE GADOBENATE DIMEGLUMINE 529 MG/ML IV SOLN  COMPARISON:  01/26/2014 and 07/13/2012.  FINDINGS: Normal heart size without pericardial or pleural effusion. Suspect left cardiophrenic angle adenopathy on  image 26/series 13002. New left perivertebral/retrocrural adenopathy, at 1.0 cm on image 24/ series 1302.  Normal liver, without intrahepatic biliary ductal dilatation. Patent hepatic and portal veins.  Normal spleen.  Surgical clips at the gastroesophageal junction.  Improved appearance of the gallbladder. Probable sludge, without acute cholecystitis.  The pancreatic duct is mildly dilated, including at 6 mm on image 21/series 4. This is followed to the level of the ampulla.  The common duct is tortuous. Measures  8 mm in the region of the porta hepatis. Undergoes an area of apparent narrowing just above the pancreatic head. Favored to be due to tortuosity. Example image 35/series 4. The more distal common duct is mildly dilated for age at 1.0 cm on image 23/series 4. Also followed to the level of the ampulla, without evidence of choledocholithiasis.  Normal left kidney. Mild right sided hydronephrosis and hydroureter. No cause identified.  Development of retroperitoneal adenopathy. An index left periaortic node measures 1.6 cm on image 69/series 13002. New small volume ascites.  Innumerable foci of abnormal T2 hyperintensity and hyper enhancement throughout the thoracolumbar spine. Example image 10/series 11 and image 39/series 14.  IMPRESSION: 1. Findings suspicious for metastatic disease, including new thoracoabdominal adenopathy and bone lesions. No definite primary identified. Consider oncology consultation and possible PET. These results will be called to the ordering clinician or representative by the Radiologist Assistant, and communication documented in the PACS or zVision Dashboard. 2. Common duct and pancreatic duct dilatation, without obstructive stone or mass identified. Otherwise occult ampullary lesion or ampullary stenosis cannot be excluded. Depending on the oncology workup, ERCP should be considered. 3. Resolution of left-sided hydronephrosis since 01/26/2014. Mild right-sided hydronephrosis is  without definite cause. 4. New small volume ascites.   Electronically Signed   By: Abigail Miyamoto M.D.   On: 04/04/2014 12:32   Nm Pet Image Initial (pi) Skull Base To Thigh  04/20/2014   CLINICAL DATA:  Initial treatment strategy for lymphadenopathy.  EXAM: NUCLEAR MEDICINE PET SKULL BASE TO THIGH  TECHNIQUE: 8.5 mCi F-18 FDG was injected intravenously. Full-ring PET imaging was performed from the skull base to thigh after the radiotracer. CT data was obtained and used for attenuation correction and anatomic localization.  FASTING BLOOD GLUCOSE:  Value:  124  mg/dl  COMPARISON:  MRI 04/04/2014, CT 01/26/2014  FINDINGS: NECK  No hypermetabolic lymph nodes in the neck  CHEST  No hypermetabolic mediastinal lymph nodes. There is a small 3 mm pulmonary nodule in the right upper lobe (image 37, series 8.  ABDOMEN/PELVIS  There are two hypermetabolic lymph nodes within the porta hepatis. These are difficult to define on the non contrast CT. One is posterior to the pancreatic head measuring approximately 11 mm (image 137 ) with SUV max = 5.9. The second is superior to the pancreas (image 132) with SUV max equal 4.5. There is no discrete hypermetabolic abnormal activity within the pancreas.  There is no abnormal hypermetabolic activity within the liver. There is ascites in the upper abdomen. There are no more distal hypermetabolic lymph nodes in the pelvis.  SKELETON  There is widespread skeletal metastasis. There are multiple sites of abnormal hypermetabolic activity within the pelvis, spine, and proximal femurs and humeri. These foci of abnormal hypermetabolic back tibia or associated with sclerotic lesions. For example in the right aspect of the L5 vertebral body there is a 3 cm lytic lesion with SUV max equal 10.6. Within the right humeral head there is a 1.6 cm lesion with SUV max equal 7.6. Metastatic lesions involve nearly all the vertebral bodies.  IMPRESSION: 1. Hypermetabolic periportal /peripancreatic  lymphadenopathy is consistent with metastatic adenopathy. No discrete hypermetabolic activity is noted within the pancreas but concern exists for pancreatic neoplasm. 2. No evidence of liver metastasis. 3. Small 3 mm right middle lobe pulmonary nodule. Recommend attention on follow-up 4. 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   Electronically Signed   By: Suzy Bouchard  M.D.   On: 04/20/2014 09:15    Assessment and Plan:    73 year old gentleman with the following issues:  1. Likely metastatic cancer presenting with lymphadenopathy and sclerotic bony metastasis. He is scheduled to have an endoscopic ultrasound and biopsy on 05/03/2014. The differential diagnosis was discussed with the patient today extensively. This is most likely represents a malignancy and family doubts a benign etiology. Given his GI symptoms, a primary GI cancer is a possibility. These would include esophageal, gastric, pancreatic or bile duct tumors. Certainly squamous cell carcinoma would be a consideration from the genitourinary tract as well. I doubt that this is lymphoma as evidence by the imaging studies. His MR of the abdomen, his chest CT as well as PET CT scan all reviewed with the patient today and based on that the differential is as outlined above. For management standpoint, it is critical to obtain a pathology to determine the histology of this cancer. Once that's time, we can direct treatment.  Most likely this will represent metastatic cancer of unknown primary or a suggest a GI primary. The management of this disease would be merely palliative at this point. This disease have caused quite debilitation to this gentleman and any treatment would be directed towards palliation of symptoms. The goal will be to alleviate pain and early satiety and possibly extends lives. You are remission is unlikely possibility.  The plan will be at this point to  await the results of the biopsy and if it's not diagnostic will have to repeat the biopsy possible a bone biopsy.  2. Hypercalcemia: Calcium repeated today and it is at 10.0. He is status post pamidronate on 04/27/2014. He might benefit from Zometa and 4 weeks.  3. Pain: Seems to be adequately controlled at this time but he might require long acting narcotics or possibly radiation therapy to any sclerotic bony metastasis.  4. Poor nutrition and weight loss: I gave him prescription for Megace to continue to encourage additional supplements.  5. Prognosis: Likely would've very limited life expectancy and I will continue to address this issue with him on subsequent visits.  6. Followup: Shortly after his biopsy to discuss the results and the treatment plan.

## 2014-05-04 ENCOUNTER — Encounter (HOSPITAL_COMMUNITY): Payer: Self-pay | Admitting: Gastroenterology

## 2014-05-07 ENCOUNTER — Telehealth: Payer: Self-pay | Admitting: Gastroenterology

## 2014-05-07 NOTE — Telephone Encounter (Signed)
I answered all her questions.

## 2014-05-07 NOTE — Telephone Encounter (Signed)
Pts wife would like for Dr. Ardis Hughs to call and discuss results with her, states that her husband is on some medication and cannot remember what was Dr. Ardis Hughs said.

## 2014-05-11 ENCOUNTER — Telehealth: Payer: Self-pay | Admitting: Gastroenterology

## 2014-05-11 NOTE — Telephone Encounter (Signed)
Discussed with pts wife that they can get miralax over the counter and take 2-3 doses today to achieve bowel movement. Wife verbalized understanding.

## 2014-05-14 ENCOUNTER — Telehealth: Payer: Self-pay | Admitting: *Deleted

## 2014-05-14 NOTE — Telephone Encounter (Signed)
Wife calling to ask if patient had a  lab appt tomorrow?wife stated for 2 weeks patient not eating or drinking well,  can't tolerate boost, c/o stomach aches afterwards. Also c/o constipation. Has not called to report any of these issues. Referral made to barb neff, suggested hydration,  stool softners, otc pepcid. Wife will bring a list of problems to be addressed @ visit tomorrow.

## 2014-05-15 ENCOUNTER — Ambulatory Visit (HOSPITAL_BASED_OUTPATIENT_CLINIC_OR_DEPARTMENT_OTHER): Payer: Medicare Other | Admitting: Oncology

## 2014-05-15 ENCOUNTER — Telehealth: Payer: Self-pay | Admitting: Oncology

## 2014-05-15 ENCOUNTER — Other Ambulatory Visit: Payer: Self-pay | Admitting: Oncology

## 2014-05-15 ENCOUNTER — Ambulatory Visit (HOSPITAL_BASED_OUTPATIENT_CLINIC_OR_DEPARTMENT_OTHER): Payer: Medicare Other

## 2014-05-15 ENCOUNTER — Encounter: Payer: Self-pay | Admitting: Oncology

## 2014-05-15 ENCOUNTER — Other Ambulatory Visit: Payer: Self-pay | Admitting: *Deleted

## 2014-05-15 VITALS — BP 138/75 | HR 78 | Temp 97.7°F | Resp 18 | Ht 71.0 in | Wt 125.4 lb

## 2014-05-15 DIAGNOSIS — C7951 Secondary malignant neoplasm of bone: Secondary | ICD-10-CM

## 2014-05-15 DIAGNOSIS — C7952 Secondary malignant neoplasm of bone marrow: Secondary | ICD-10-CM

## 2014-05-15 DIAGNOSIS — C801 Malignant (primary) neoplasm, unspecified: Secondary | ICD-10-CM

## 2014-05-15 DIAGNOSIS — M549 Dorsalgia, unspecified: Secondary | ICD-10-CM

## 2014-05-15 DIAGNOSIS — M25559 Pain in unspecified hip: Secondary | ICD-10-CM

## 2014-05-15 DIAGNOSIS — K5900 Constipation, unspecified: Secondary | ICD-10-CM

## 2014-05-15 LAB — CBC WITH DIFFERENTIAL/PLATELET
BASO%: 0.7 % (ref 0.0–2.0)
Basophils Absolute: 0.1 10*3/uL (ref 0.0–0.1)
EOS ABS: 0.2 10*3/uL (ref 0.0–0.5)
EOS%: 2.4 % (ref 0.0–7.0)
HCT: 40 % (ref 38.4–49.9)
HGB: 12.8 g/dL — ABNORMAL LOW (ref 13.0–17.1)
LYMPH%: 13.5 % — AB (ref 14.0–49.0)
MCH: 27.1 pg — ABNORMAL LOW (ref 27.2–33.4)
MCHC: 32 g/dL (ref 32.0–36.0)
MCV: 84.7 fL (ref 79.3–98.0)
MONO#: 1 10*3/uL — AB (ref 0.1–0.9)
MONO%: 10.5 % (ref 0.0–14.0)
NEUT#: 6.9 10*3/uL — ABNORMAL HIGH (ref 1.5–6.5)
NEUT%: 72.9 % (ref 39.0–75.0)
Platelets: 242 10*3/uL (ref 140–400)
RBC: 4.72 10*6/uL (ref 4.20–5.82)
RDW: 13.6 % (ref 11.0–14.6)
WBC: 9.5 10*3/uL (ref 4.0–10.3)
lymph#: 1.3 10*3/uL (ref 0.9–3.3)

## 2014-05-15 LAB — COMPREHENSIVE METABOLIC PANEL (CC13)
ALT: 42 U/L (ref 0–55)
ANION GAP: 11 meq/L (ref 3–11)
AST: 36 U/L — ABNORMAL HIGH (ref 5–34)
Albumin: 3.3 g/dL — ABNORMAL LOW (ref 3.5–5.0)
Alkaline Phosphatase: 494 U/L — ABNORMAL HIGH (ref 40–150)
BILIRUBIN TOTAL: 0.66 mg/dL (ref 0.20–1.20)
BUN: 14.7 mg/dL (ref 7.0–26.0)
CO2: 25 meq/L (ref 22–29)
CREATININE: 1.3 mg/dL (ref 0.7–1.3)
Calcium: 11.9 mg/dL — ABNORMAL HIGH (ref 8.4–10.4)
Chloride: 100 mEq/L (ref 98–109)
GLUCOSE: 116 mg/dL (ref 70–140)
Potassium: 4.8 mEq/L (ref 3.5–5.1)
Sodium: 136 mEq/L (ref 136–145)
Total Protein: 7.3 g/dL (ref 6.4–8.3)

## 2014-05-15 LAB — TECHNOLOGIST REVIEW

## 2014-05-15 MED ORDER — FENTANYL 25 MCG/HR TD PT72: 25.0000 ug | MEDICATED_PATCH | TRANSDERMAL | Status: AC

## 2014-05-15 MED ORDER — MEGESTROL ACETATE 400 MG/10ML PO SUSP: 400.0000 mg | Freq: Two times a day (BID) | ORAL | Status: DC

## 2014-05-15 MED ORDER — HYDROMORPHONE HCL 4 MG PO TABS: 4.0000 mg | ORAL_TABLET | ORAL | Status: AC | PRN

## 2014-05-15 MED ORDER — SENNOSIDES-DOCUSATE SODIUM 8.6-50 MG PO TABS: 1.0000 | ORAL_TABLET | Freq: Two times a day (BID) | ORAL | Status: AC

## 2014-05-15 NOTE — Progress Notes (Signed)
Per dr Alen Blew, patient referred to [facility] [place] [facility]. Dr Alen Blew to be the attending, hospice physicians may do symptom management and have DNR signed. They will contact patient today and set up an assessment visit for tomorrow.

## 2014-05-15 NOTE — Progress Notes (Signed)
Hematology and Oncology Follow Up Visit  GREGOIRE BENNIS [id number] [birth year] 73 y.o. 05/15/2014 4:10 PM DYER,CHRISTOPHER, MDNo ref. provider found   Principle Diagnosis: 73 year old gentleman with a metastatic adenocarcinoma of unknown primary. He presented with diffuse skeletal metastasis and lymphadenopathy. Primary tumor likely GI etiology.   Prior Therapy: Status post pamidronate given on 04/27/2014 given while he was hospitalized. He is status post endoscopic ultrasound and a biopsy on 05/03/2014. These findings showed ascites but had malignant adenocarcinoma.  Current therapy: Under consideration for therapy.  Interim History:  Mr. Lautner presents today for a followup visit. He is a gentleman I saw in consultation for the evaluation of cancer of unknown primary. Since his last is at, he underwent an endoscopic ultrasound and aspiration of abdominal ascites. The fluid showed adenocarcinoma as the likely malignancy. Since his last visit, he is continuing to deteriorate. His performance status has declined rapidly. He is reporting more back pain and hip pain. He is taking hydrocodone which is ineffective. His appetite has been poor and continues to lose weight. He is reporting early satiety and constipation. He has not reported any diarrhea has not reported any fevers or chills. Has not reported any sweats.  Medications: I have reviewed the patient's current medications.  Current Outpatient Prescriptions  Medication Sig Dispense Refill  . allopurinol (ZYLOPRIM) 300 MG tablet Take 300 mg by mouth daily.      Marland Kitchen omeprazole (PRILOSEC) 20 MG capsule Take 20 mg by mouth daily.      Marland Kitchen dicyclomine (BENTYL) 10 MG capsule Take 1 tab twice daily as needed for spasms and cramping, diarrhea.  60 capsule  1  . doxepin (SINEQUAN) 50 MG capsule Take 50 mg by mouth at bedtime as needed (Sleep).       . famotidine (PEPCID) 20 MG tablet Take 20 mg by mouth 2 (two) times daily as needed for heartburn.        . fentaNYL (DURAGESIC - DOSED MCG/HR) 25 MCG/HR patch Place 1 patch (25 mcg total) onto the skin every 3 (three) days.  10 patch  0  . gabapentin (NEURONTIN) 100 MG capsule Take 100 mg by mouth 4 (four) times daily as needed (Pain). 5 times daily for nerve pain      . HYDROcodone-acetaminophen (NORCO/VICODIN) 5-325 MG per tablet Take 1 tab every 4-6 hours as needed for pain.  50 tablet  0  . HYDROmorphone (DILAUDID) 4 MG tablet Take 1 tablet (4 mg total) by mouth every 4 (four) hours as needed for severe pain.  30 tablet  0  . lactose free nutrition (BOOST PLUS) LIQD Take 237 mLs by mouth 3 (three) times daily with meals.  90 Can  0  . megestrol (MEGACE) 400 MG/10ML suspension Take 10 mLs (400 mg total) by mouth 2 (two) times daily.  240 mL  0  . megestrol (MEGACE) 400 MG/10ML suspension Take 10 mLs (400 mg total) by mouth 2 (two) times daily.  240 mL  0  . oxybutynin (DITROPAN) 5 MG tablet Take 1 tablet (5 mg total) by mouth every 6 (six) hours as needed for bladder spasms.  40 tablet  4  . phenazopyridine (PYRIDIUM) 100 MG tablet Take 1 tablet (100 mg total) by mouth every 8 (eight) hours as needed for pain (Burning urination.  Will turn urine and body fluids orange.).  30 tablet  3  . rifaximin (XIFAXAN) 550 MG TABS tablet Take 550 mg by mouth 2 (two) times daily as needed (IBS).       Marland Kitchen  senna-docusate (SENNA S) 8.6-50 MG per tablet Take 1 tablet by mouth 2 (two) times daily.  60 tablet  1  . sildenafil (VIAGRA) 100 MG tablet Take 50 mg by mouth daily as needed for erectile dysfunction.       . tamsulosin (FLOMAX) 0.4 MG CAPS capsule Take 1 capsule (0.4 mg total) by mouth daily.  30 capsule  0  . Testosterone 20.25 MG/ACT (1.62%) GEL Place 2 application onto the skin daily as needed (Rash). Pt uses 2 pumps      . zolpidem (AMBIEN) 10 MG tablet Take 5 mg by mouth at bedtime as needed. sleep       No current facility-administered medications for this visit.     Allergies:  Allergies   Allergen Reactions  . Tylenol With Codeine #3 [Acetaminophen-Codeine] Rash    Past Medical History, Surgical history, Social history, and Family History were reviewed and updated.  Review of Systems: Constitutional:  Negative for fever, chills, night sweats, anorexia, weight loss, pain. Cardiovascular: no chest pain or dyspnea on exertion Respiratory: no cough, shortness of breath, or wheezing Neurological: no TIA or stroke symptoms Dermatological: negative ENT: negative Skin: Negative. Gastrointestinal: Your history of present illness Genito-Urinary: no dysuria, trouble voiding, or hematuria Hematological and Lymphatic: negative Breast: negative Musculoskeletal: Per history of present illness. Remaining ROS negative. Physical Exam: Blood pressure 138/75, pulse 78, temperature 97.7 F (36.5 C), temperature source Oral, resp. rate 18, height 5\' 11"  (1.803 m), weight 125 lb 6.4 oz (56.881 kg). ECOG:  General appearance: alert and cachectic Head: Normocephalic, without obvious abnormality Neck: no adenopathy Lymph nodes: Cervical, supraclavicular, and axillary nodes normal. Heart:regular rate and rhythm, S1, S2 normal, no murmur, click, rub or gallop Lung:chest clear, no wheezing, rales, normal symmetric air entry Abdomin: soft, non-tender, without masses or organomegaly EXT:no erythema, induration, or nodules   Lab Results: Lab Results  Component Value Date   WBC 9.5 05/15/2014   HGB 12.8* 05/15/2014   HCT 40.0 05/15/2014   MCV 84.7 05/15/2014   PLT 242 05/15/2014     Chemistry      Component Value Date/Time   NA 136 05/15/2014 1505   NA 141 04/27/2014 0400   K 4.8 05/15/2014 1505   K 3.7 04/27/2014 0400   CL 99 04/27/2014 0400   CO2 25 05/15/2014 1505   CO2 29 04/27/2014 0400   BUN 14.7 05/15/2014 1505   BUN 18 04/27/2014 0400   CREATININE 1.3 05/15/2014 1505   CREATININE 1.45* 04/27/2014 0400      Component Value Date/Time   CALCIUM 11.9* 05/15/2014 1505   CALCIUM 12.0*  04/27/2014 0400   CALCIUM 11.2* 04/26/2014 1600   ALKPHOS 494* 05/15/2014 1505   ALKPHOS 290* 04/26/2014 1047   AST 36* 05/15/2014 1505   AST 36 04/26/2014 1047   ALT 42 05/15/2014 1505   ALT 18 04/26/2014 1047   BILITOT 0.66 05/15/2014 1505   BILITOT 0.5 04/26/2014 1047       Impression and Plan:  73 year old with the following issues:  1. Metastatic adenocarcinoma presented with diffuse adenopathy and bone disease documented by a PET scan on 04/2014. The pathology was confirmed by aspiration of ascitic fluid by endoscopic ultrasound on 05/03/2014. The natural course of adenocarcinoma of unknown primary in the differential diagnosis was discussed with the patient today. Likely etiology will be GI/GU and possibly lung etiology. Options of treatment were discussed today and certainly all of our options are palliative at this time. There is no role  for surgery. Radiation therapy would be used to palliate any specific bone pain. Systemic chemotherapy might offer some palliation of symptoms. Combination chemotherapy whether it's platinum-based or 5-FU-based would be aimed towards reducing the cancer burden and hopefully improving the quality of life. The success rates is rather low and probably less than 10%. Complications would include nausea, vomiting, myelosuppression are likely exacerbated given the patient poor performance status. The alternative to systemic chemotherapy would be supportive care only and hospice. After discussion today, the patient had agreed to proceed with hospice and we will forego systemic chemotherapy.  I feel that his prognosis is rather poor and has very limited life expectancy of less than 6 months.  2. Pain: His pain is rather diffuse in nature and it's not focal. I've given him prescription for fentanyl patch to apply every 72 hours. I also given him prescription for Dilaudid did for breakthrough pain.  3. Constipation: Given a prescription for stool softener in the form of  Senokot S. to take with his pain medication.  4. Followup: Will be in 3-4 weeks and certainly we will rely on hospice for updates regarding his issues.  Zola Button, MD 6/30/20154:10 PM

## 2014-05-15 NOTE — Telephone Encounter (Signed)
Gave pt appt for lab and MD  °

## 2014-05-17 ENCOUNTER — Encounter: Payer: Self-pay | Admitting: Nutrition

## 2014-05-17 NOTE — Progress Notes (Signed)
Patient has experienced difficulty eating and pain with oral intake. Oral nutrition supplements not tolerated.  Noted patient has been referred to Hospice. Am available to assist patient as needed with symptom management.

## 2014-05-22 ENCOUNTER — Encounter: Payer: Self-pay | Admitting: Internal Medicine

## 2014-05-22 ENCOUNTER — Telehealth: Payer: Self-pay | Admitting: *Deleted

## 2014-05-22 NOTE — Progress Notes (Signed)
Put daughter's fmla form on nurse's desk. °

## 2014-05-22 NOTE — Telephone Encounter (Signed)
Nurse nicole with hospice calling to ask if okay to use liquid mso4 instead of dilaudid tabs, d/t patient having difficulty  Swallowing. Okay per dr Alen Blew, hospice MD's may do symptom management and dosing. Order given to nurse wendy [phone]

## 2014-05-28 ENCOUNTER — Encounter: Payer: Self-pay | Admitting: Oncology

## 2014-05-28 NOTE — Progress Notes (Signed)
Faxed daughter's fmla form to [facility] @ [phone]. °

## 2014-06-06 ENCOUNTER — Other Ambulatory Visit: Payer: Self-pay | Admitting: *Deleted

## 2014-06-06 MED ORDER — NYSTATIN 100000 UNIT/ML MT SUSP: 5.0000 mL | Freq: Four times a day (QID) | OROMUCOSAL | Status: AC

## 2014-06-06 NOTE — Telephone Encounter (Signed)
E-scribed nystatin susp to [facility] [address]. Hospice nurse nicole reports a white coating on patient's tongue, that will not rub off. nicole notified via vm.

## 2014-06-08 ENCOUNTER — Other Ambulatory Visit: Payer: Self-pay | Admitting: Medical Oncology

## 2014-06-08 DIAGNOSIS — C7951 Secondary malignant neoplasm of bone: Secondary | ICD-10-CM

## 2014-06-08 MED ORDER — MEGESTROL ACETATE 400 MG/10ML PO SUSP: 400.0000 mg | Freq: Two times a day (BID) | ORAL | Status: AC

## 2014-06-08 NOTE — Telephone Encounter (Signed)
Patient wife called requesting refill for megace and to be sent to [facility] on [address]. Per MD ok to refill. Wife informed prescription sent.

## 2014-06-10 ENCOUNTER — Inpatient Hospital Stay (HOSPITAL_COMMUNITY)
Admission: EM | Admit: 2014-06-10 | Discharge: 2014-06-16 | DRG: 091 | Disposition: E | Attending: Internal Medicine | Admitting: Internal Medicine

## 2014-06-10 ENCOUNTER — Encounter (HOSPITAL_COMMUNITY): Payer: Self-pay | Admitting: Emergency Medicine

## 2014-06-10 DIAGNOSIS — D696 Thrombocytopenia, unspecified: Secondary | ICD-10-CM | POA: Diagnosis present

## 2014-06-10 DIAGNOSIS — F329 Major depressive disorder, single episode, unspecified: Secondary | ICD-10-CM | POA: Diagnosis present

## 2014-06-10 DIAGNOSIS — G92 Toxic encephalopathy: Principal | ICD-10-CM | POA: Diagnosis present

## 2014-06-10 DIAGNOSIS — I82402 Acute embolism and thrombosis of unspecified deep veins of left lower extremity: Secondary | ICD-10-CM

## 2014-06-10 DIAGNOSIS — R0902 Hypoxemia: Secondary | ICD-10-CM | POA: Diagnosis present

## 2014-06-10 DIAGNOSIS — F411 Generalized anxiety disorder: Secondary | ICD-10-CM | POA: Diagnosis present

## 2014-06-10 DIAGNOSIS — N179 Acute kidney failure, unspecified: Secondary | ICD-10-CM | POA: Diagnosis present

## 2014-06-10 DIAGNOSIS — R627 Adult failure to thrive: Secondary | ICD-10-CM | POA: Diagnosis present

## 2014-06-10 DIAGNOSIS — Z885 Allergy status to narcotic agent status: Secondary | ICD-10-CM

## 2014-06-10 DIAGNOSIS — Z79899 Other long term (current) drug therapy: Secondary | ICD-10-CM | POA: Diagnosis not present

## 2014-06-10 DIAGNOSIS — Z87891 Personal history of nicotine dependence: Secondary | ICD-10-CM | POA: Diagnosis not present

## 2014-06-10 DIAGNOSIS — C801 Malignant (primary) neoplasm, unspecified: Secondary | ICD-10-CM | POA: Diagnosis present

## 2014-06-10 DIAGNOSIS — E43 Unspecified severe protein-calorie malnutrition: Secondary | ICD-10-CM | POA: Diagnosis present

## 2014-06-10 DIAGNOSIS — I158 Other secondary hypertension: Secondary | ICD-10-CM | POA: Diagnosis present

## 2014-06-10 DIAGNOSIS — Z681 Body mass index (BMI) 19 or less, adult: Secondary | ICD-10-CM

## 2014-06-10 DIAGNOSIS — Z515 Encounter for palliative care: Secondary | ICD-10-CM | POA: Diagnosis not present

## 2014-06-10 DIAGNOSIS — J45909 Unspecified asthma, uncomplicated: Secondary | ICD-10-CM | POA: Diagnosis present

## 2014-06-10 DIAGNOSIS — C7952 Secondary malignant neoplasm of bone marrow: Secondary | ICD-10-CM

## 2014-06-10 DIAGNOSIS — Z809 Family history of malignant neoplasm, unspecified: Secondary | ICD-10-CM | POA: Diagnosis not present

## 2014-06-10 DIAGNOSIS — R4189 Other symptoms and signs involving cognitive functions and awareness: Secondary | ICD-10-CM

## 2014-06-10 DIAGNOSIS — K219 Gastro-esophageal reflux disease without esophagitis: Secondary | ICD-10-CM | POA: Diagnosis present

## 2014-06-10 DIAGNOSIS — R6251 Failure to thrive (child): Secondary | ICD-10-CM | POA: Diagnosis present

## 2014-06-10 DIAGNOSIS — G929 Unspecified toxic encephalopathy: Secondary | ICD-10-CM | POA: Diagnosis present

## 2014-06-10 DIAGNOSIS — M7989 Other specified soft tissue disorders: Secondary | ICD-10-CM | POA: Diagnosis present

## 2014-06-10 DIAGNOSIS — R5383 Other fatigue: Secondary | ICD-10-CM | POA: Diagnosis present

## 2014-06-10 DIAGNOSIS — M129 Arthropathy, unspecified: Secondary | ICD-10-CM | POA: Diagnosis present

## 2014-06-10 DIAGNOSIS — R5381 Other malaise: Secondary | ICD-10-CM | POA: Diagnosis present

## 2014-06-10 DIAGNOSIS — F3289 Other specified depressive episodes: Secondary | ICD-10-CM | POA: Diagnosis present

## 2014-06-10 DIAGNOSIS — I82409 Acute embolism and thrombosis of unspecified deep veins of unspecified lower extremity: Secondary | ICD-10-CM

## 2014-06-10 DIAGNOSIS — Z66 Do not resuscitate: Secondary | ICD-10-CM | POA: Diagnosis present

## 2014-06-10 DIAGNOSIS — R599 Enlarged lymph nodes, unspecified: Secondary | ICD-10-CM | POA: Diagnosis present

## 2014-06-10 DIAGNOSIS — R52 Pain, unspecified: Secondary | ICD-10-CM | POA: Diagnosis present

## 2014-06-10 DIAGNOSIS — G9340 Encephalopathy, unspecified: Secondary | ICD-10-CM | POA: Diagnosis present

## 2014-06-10 DIAGNOSIS — Z8249 Family history of ischemic heart disease and other diseases of the circulatory system: Secondary | ICD-10-CM

## 2014-06-10 DIAGNOSIS — E860 Dehydration: Secondary | ICD-10-CM | POA: Diagnosis present

## 2014-06-10 DIAGNOSIS — C7951 Secondary malignant neoplasm of bone: Secondary | ICD-10-CM | POA: Diagnosis present

## 2014-06-10 DIAGNOSIS — T4275XA Adverse effect of unspecified antiepileptic and sedative-hypnotic drugs, initial encounter: Secondary | ICD-10-CM | POA: Diagnosis present

## 2014-06-10 DIAGNOSIS — I959 Hypotension, unspecified: Secondary | ICD-10-CM | POA: Diagnosis present

## 2014-06-10 LAB — CBC WITH DIFFERENTIAL/PLATELET
BASOS PCT: 3 % — AB (ref 0–1)
Basophils Absolute: 0.3 10*3/uL — ABNORMAL HIGH (ref 0.0–0.1)
EOS ABS: 0.1 10*3/uL (ref 0.0–0.7)
EOS PCT: 1 % (ref 0–5)
HCT: 35.1 % — ABNORMAL LOW (ref 39.0–52.0)
HEMOGLOBIN: 11.6 g/dL — AB (ref 13.0–17.0)
Lymphocytes Relative: 23 % (ref 12–46)
Lymphs Abs: 2.5 10*3/uL (ref 0.7–4.0)
MCH: 27.4 pg (ref 26.0–34.0)
MCHC: 33 g/dL (ref 30.0–36.0)
MCV: 82.8 fL (ref 78.0–100.0)
Monocytes Absolute: 0.8 10*3/uL (ref 0.1–1.0)
Monocytes Relative: 7 % (ref 3–12)
NEUTROS PCT: 66 % (ref 43–77)
Neutro Abs: 7 10*3/uL (ref 1.7–7.7)
Platelets: 98 10*3/uL — ABNORMAL LOW (ref 150–400)
RBC: 4.24 MIL/uL (ref 4.22–5.81)
RDW: 14.2 % (ref 11.5–15.5)
WBC MORPHOLOGY: INCREASED
WBC: 10.7 10*3/uL — ABNORMAL HIGH (ref 4.0–10.5)

## 2014-06-10 LAB — COMPREHENSIVE METABOLIC PANEL
ALBUMIN: 2.4 g/dL — AB (ref 3.5–5.2)
ALT: 20 U/L (ref 0–53)
AST: 40 U/L — AB (ref 0–37)
Alkaline Phosphatase: 960 U/L — ABNORMAL HIGH (ref 39–117)
Anion gap: 24 — ABNORMAL HIGH (ref 5–15)
BILIRUBIN TOTAL: 1.3 mg/dL — AB (ref 0.3–1.2)
BUN: 102 mg/dL — ABNORMAL HIGH (ref 6–23)
CHLORIDE: 98 meq/L (ref 96–112)
CO2: 17 mEq/L — ABNORMAL LOW (ref 19–32)
CREATININE: 3.76 mg/dL — AB (ref 0.50–1.35)
Calcium: 15 mg/dL (ref 8.4–10.5)
GFR calc Af Amer: 17 mL/min — ABNORMAL LOW (ref >90)
GFR calc non Af Amer: 15 mL/min — ABNORMAL LOW (ref >90)
Glucose, Bld: 186 mg/dL — ABNORMAL HIGH (ref 70–99)
POTASSIUM: 5.2 meq/L (ref 3.7–5.3)
Sodium: 139 mEq/L (ref 137–147)
Total Protein: 6.6 g/dL (ref 6.0–8.3)

## 2014-06-10 LAB — MAGNESIUM: Magnesium: 3.3 mg/dL — ABNORMAL HIGH (ref 1.5–2.5)

## 2014-06-10 LAB — I-STAT CG4 LACTIC ACID, ED: LACTIC ACID, VENOUS: 4.99 mmol/L — AB (ref 0.5–2.2)

## 2014-06-10 MED ORDER — ONDANSETRON HCL 4 MG/2ML IJ SOLN: 4.0000 mg | Freq: Four times a day (QID) | INTRAMUSCULAR | Status: DC | PRN

## 2014-06-10 MED ORDER — MEGESTROL ACETATE 400 MG/10ML PO SUSP
400.0000 mg | Freq: Two times a day (BID) | ORAL | Status: DC
  Filled 2014-06-10 (×3): qty 10

## 2014-06-10 MED ORDER — SODIUM CHLORIDE 0.9 % IV BOLUS (SEPSIS)
1000.0000 mL | Freq: Once | INTRAVENOUS | Status: DC
  Administered 2014-06-10: 1000 mL via INTRAVENOUS

## 2014-06-10 MED ORDER — FENTANYL CITRATE 0.05 MG/ML IJ SOLN
50.0000 ug | Freq: Once | INTRAMUSCULAR | Status: AC
  Administered 2014-06-10: 50 ug via INTRAVENOUS
  Filled 2014-06-10: qty 2

## 2014-06-10 MED ORDER — GUAIFENESIN-DM 100-10 MG/5ML PO SYRP: 5.0000 mL | ORAL_SOLUTION | ORAL | Status: DC | PRN

## 2014-06-10 MED ORDER — ALBUTEROL SULFATE (2.5 MG/3ML) 0.083% IN NEBU: 2.5000 mg | INHALATION_SOLUTION | RESPIRATORY_TRACT | Status: DC | PRN

## 2014-06-10 MED ORDER — TAMSULOSIN HCL 0.4 MG PO CAPS
0.4000 mg | ORAL_CAPSULE | Freq: Every day | ORAL | Status: DC
  Filled 2014-06-10 (×2): qty 1

## 2014-06-10 MED ORDER — ACETAMINOPHEN 325 MG PO TABS: 650.0000 mg | ORAL_TABLET | Freq: Four times a day (QID) | ORAL | Status: DC | PRN

## 2014-06-10 MED ORDER — MORPHINE SULFATE (CONCENTRATE) 10 MG /0.5 ML PO SOLN
10.0000 mg | ORAL | Status: DC | PRN
Start: 2014-06-10 — End: 2014-06-12
  Filled 2014-06-10: qty 0.5

## 2014-06-10 MED ORDER — SODIUM CHLORIDE 0.9 % IV BOLUS (SEPSIS)
1000.0000 mL | Freq: Once | INTRAVENOUS | Status: AC
  Administered 2014-06-10: 1000 mL via INTRAVENOUS

## 2014-06-10 MED ORDER — ACETAMINOPHEN 650 MG RE SUPP: 650.0000 mg | Freq: Four times a day (QID) | RECTAL | Status: DC | PRN

## 2014-06-10 MED ORDER — FENTANYL 50 MCG/HR TD PT72
50.0000 ug | MEDICATED_PATCH | TRANSDERMAL | Status: DC
  Administered 2014-06-10: 50 ug via TRANSDERMAL
  Filled 2014-06-10: qty 1

## 2014-06-10 MED ORDER — EPINEPHRINE HCL 1 MG/ML IJ SOLN
0.5000 ug/min | INTRAVENOUS | Status: DC
  Administered 2014-06-10: 2 ug/min via INTRAVENOUS
  Filled 2014-06-10: qty 1

## 2014-06-10 MED ORDER — SODIUM CHLORIDE 0.9 % IV SOLN: INTRAVENOUS | Status: DC

## 2014-06-10 MED ORDER — LORAZEPAM 2 MG/ML IJ SOLN: 1.0000 mg | INTRAMUSCULAR | Status: DC | PRN

## 2014-06-10 MED ORDER — ONDANSETRON HCL 4 MG PO TABS: 4.0000 mg | ORAL_TABLET | Freq: Four times a day (QID) | ORAL | Status: DC | PRN

## 2014-06-10 MED ORDER — BOOST PLUS PO LIQD
237.0000 mL | Freq: Three times a day (TID) | ORAL | Status: DC
  Filled 2014-06-10 (×3): qty 237

## 2014-06-10 MED ORDER — MORPHINE SULFATE 2 MG/ML IJ SOLN: 2.0000 mg | INTRAMUSCULAR | Status: DC | PRN

## 2014-06-10 MED ORDER — OXYBUTYNIN CHLORIDE 5 MG PO TABS
5.0000 mg | ORAL_TABLET | Freq: Four times a day (QID) | ORAL | Status: DC | PRN
  Filled 2014-06-10: qty 1

## 2014-06-10 MED ORDER — SODIUM CHLORIDE 0.9 % IV SOLN
2.0000 mg/h | INTRAVENOUS | Status: DC
  Administered 2014-06-10: 2 mg/h via INTRAVENOUS
  Filled 2014-06-10: qty 10

## 2014-06-10 NOTE — ED Notes (Signed)
Dr. Vanita Panda made aware of lactic acid of 4.99

## 2014-06-10 NOTE — ED Notes (Signed)
[facility] able to accommodate patient and Morphine infusion Report given--all questions answered

## 2014-06-10 NOTE — ED Notes (Signed)
Patient placed in own clothing, per family request Mouth care given--patient tolerated well Patient and family denied further needs at this time Side rails up, call bell in reach

## 2014-06-10 NOTE — H&P (Addendum)
PATIENT DETAILS Name: Daniel Huynh Age: 73 y.o. Sex: male Date of Birth: [birth year] Admit Date: 05/27/2014 [id number], MD   CHIEF COMPLAINT:  Weakness, confusion, hypotension  HPI: Daniel Huynh is a 73 y.o. 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 Most of this history is obtained from family members at bedside, apparently patient has had significant deterioration in his overall general health in the past few weeks, however for the past few days he has had significantly poor oral intake, and his weakness has worsened. He has significant generalized pain,that has not responded well to transdermal fentanyl and oral Dilaudid. Today, patient was found to be tachypneic, minimally responsive at home, EMS was called, patient was found to be hypotensive with blood pressure of 50/30, hypoxic and minimally reactive, patient was given IV fluids, started on epinephrine infusion in route, and administered Narcan with some improvement in responsiveness. Further workup in the ED revealed hypercalcemia, acute renal failure and thrombocytopenia. Patient also was noted to have significant swelling in his left lower extremity that started yesterday. Initially patient was presumed to be a full code, however after discussion with the family by ED M.D., patient was made a DO NOT RESUSCITATE. Patient is now being admitted for comfort measures and  pain control.   ALLERGIES:   Allergies  Allergen Reactions  . Tylenol With Codeine #3 [Acetaminophen-Codeine] Rash    PAST MEDICAL HISTORY: Past Medical History  Diagnosis Date  . Depression   . Anxiety   . Unintentional weight loss   . Diarrhea   . Difficulty urinating   . Arthritis   . Diverticulitis of colon   . Intrinsic asthma     mild   . GERD (gastroesophageal  reflux disease)   . Generalized headaches     "monthly" (04/26/2014)  . Metastasis     unknown type/notes 04/26/2014  . Poor appetite     05-01-14 appepetite has improved of late and showing slight weight gain.    PAST SURGICAL HISTORY: Past Surgical History  Procedure Laterality Date  . Laparoscopic gastrotomy w/ repair of ulcer  1979  . Cystoscopy with retrograde pyelogram, ureteroscopy and stent placement Left 01/30/2014    Procedure: CYSTOSCOPY WITH RETROGRADE PYELOGRAM, URETEROSCOPY AND STENT PLACEMENT;  Surgeon: Molli Hazard, MD;  Location[facility];  Service: Urology;  Laterality: Left;  . Holmium laser application Left 8/34/1962    Procedure: HOLMIUM LASER APPLICATION;  Surgeon: Molli Hazard, MD;  Location: [facility];  Service: Urology;  Laterality: Left;  . Tonsillectomy      "as a kid"  . Eus N/A 05/03/2014    Procedure: UPPER ENDOSCOPIC ULTRASOUND (EUS) LINEAR;  Surgeon: Milus Banister, MD;  Location: [facility];  Service: Endoscopy;  Laterality: N/A;    MEDICATIONS AT HOME: Prior to Admission medications   Medication Sig Start Date End Date Taking? Authorizing Provider  allopurinol (ZYLOPRIM) 300 MG tablet Take 300 mg by mouth daily.    Historical Provider, MD  dicyclomine (BENTYL) 10 MG capsule Take 1 tab twice daily as needed for spasms and cramping, diarrhea. 01/25/14   Amy S Esterwood, PA-C  doxepin (SINEQUAN) 50 MG capsule Take 50 mg by mouth at bedtime as needed (Sleep).     Historical Provider, MD  famotidine (PEPCID) 20 MG tablet Take 20 mg  by mouth 2 (two) times daily as needed for heartburn.     Historical Provider, MD  fentaNYL (DURAGESIC - DOSED MCG/HR) 25 MCG/HR patch Place 1 patch (25 mcg total) onto the skin every 3 (three) days. 05/15/14   Wyatt Portela, MD  gabapentin (NEURONTIN) 100 MG capsule Take 100 mg by mouth 4 (four) times daily as needed (Pain). 5 times daily for nerve pain    Historical Provider, MD   HYDROcodone-acetaminophen (NORCO/VICODIN) 5-325 MG per tablet Take 1 tab every 4-6 hours as needed for pain. 05/03/14   Wyatt Portela, MD  HYDROmorphone (DILAUDID) 4 MG tablet Take 1 tablet (4 mg total) by mouth every 4 (four) hours as needed for severe pain. 05/15/14   Wyatt Portela, MD  lactose free nutrition (BOOST PLUS) LIQD Take 237 mLs by mouth 3 (three) times daily with meals. 04/27/14   Joni Reining, DO  megestrol (MEGACE) 400 MG/10ML suspension Take 10 mLs (400 mg total) by mouth 2 (two) times daily. 05/03/14   Wyatt Portela, MD  megestrol (MEGACE) 400 MG/10ML suspension Take 10 mLs (400 mg total) by mouth 2 (two) times daily. 06/08/14   Wyatt Portela, MD  nystatin (MYCOSTATIN) 100000 UNIT/ML suspension Take 5 mLs (500,000 Units total) by mouth 4 (four) times daily. 06/06/14   Maryanna Shape, NP  omeprazole (PRILOSEC) 20 MG capsule Take 20 mg by mouth daily.    Historical Provider, MD  oxybutynin (DITROPAN) 5 MG tablet Take 1 tablet (5 mg total) by mouth every 6 (six) hours as needed for bladder spasms. 01/30/14   Sharyn Creamer, MD  phenazopyridine (PYRIDIUM) 100 MG tablet Take 1 tablet (100 mg total) by mouth every 8 (eight) hours as needed for pain (Burning urination.  Will turn urine and body fluids orange.). 01/30/14   Sharyn Creamer, MD  rifaximin (XIFAXAN) 550 MG TABS tablet Take 550 mg by mouth 2 (two) times daily as needed (IBS).     Historical Provider, MD  senna-docusate (SENNA S) 8.6-50 MG per tablet Take 1 tablet by mouth 2 (two) times daily. 05/15/14   Wyatt Portela, MD  sildenafil (VIAGRA) 100 MG tablet Take 50 mg by mouth daily as needed for erectile dysfunction.     Historical Provider, MD  tamsulosin (FLOMAX) 0.4 MG CAPS capsule Take 1 capsule (0.4 mg total) by mouth daily. 01/26/14   Martie Lee, PA-C  Testosterone 20.25 MG/ACT (1.62%) GEL Place 2 application onto the skin daily as needed (Rash). Pt uses 2 pumps    Historical Provider, MD  zolpidem (AMBIEN) 10 MG  tablet Take 5 mg by mouth at bedtime as needed. sleep    Historical Provider, MD    FAMILY HISTORY: Family History  Problem Relation Age of Onset  . Heart disease Mother   . Cancer Sister     unknown type    SOCIAL HISTORY:  reports that he quit smoking about 43 years ago. His smoking use included Cigarettes. He has a 2.5 pack-year smoking history. He has never used smokeless tobacco. He reports that he drinks about .6 ounces of alcohol per week. He reports that he does not use illicit drugs.  REVIEW OF SYSTEMS:  Constitutional:   In obvious discomfort from generalized pain, very cachectic.  HEENT:    No sneezing, itching, ear ache, nasal congestion, post nasal drip  Cardio-vascular: No chest pain,  Orthopnea, PND  GI:  No heartburn, indigestion  Resp: No shortness of breath with exertion or  at rest.   Skin:  no rash or lesions.  GU:  no dysuria, change in color of urine, no urgency or frequency.  No flank pain.  Musculoskeletal: No joint pain or swelling.  No decreased range of motion.    Psych: No change in mood or affect.    PHYSICAL EXAM: Blood pressure 120/43, pulse 32, temperature 0 F (-17.8 C), resp. rate 25, SpO2 95.00%.  General appearance :Awake but lethargic, mostly alert, in obvious distress due to pain Speech slow but Clear. Very cachectic  HEENT: Atraumatic and Normocephalic, pupils equally reactive to light and accomodation Neck: supple, no JVD. No cervical lymphadenopathy.  Chest:Good air entry bilaterally, no added sounds  CVS: S1 S2 regular, no murmurs.  Abdomen: Bowel sounds present, diffusely tender all over, mild guarding. Abdomen is soft however e Extremities:Left  Lower Ext shows- diffuse swelling, right lower extremity no edema, both legs are warm to touch Neurology: Nonfocal-however at which severe generalized weakness Skin:No Rash Wounds:N/A  LABS ON ADMISSION:   Recent Labs  05/24/2014 1728  NA 139  K 5.2  CL 98  CO2 17*    GLUCOSE 186*  BUN 102*  CREATININE 3.76*  CALCIUM 15.0*  MG 3.3*    Recent Labs  06/01/2014 1728  AST 40*  ALT 20  ALKPHOS 960*  BILITOT 1.3*  PROT 6.6  ALBUMIN 2.4*   No results found for this basename: LIPASE, AMYLASE,  in the last 72 hours  Recent Labs  06/01/2014 1728  WBC 10.7*  NEUTROABS 7.0  HGB 11.6*  HCT 35.1*  MCV 82.8  PLT 98*   No results found for this basename: CKTOTAL, CKMB, CKMBINDEX, TROPONINI,  in the last 72 hours No results found for this basename: DDIMER,  in the last 72 hours No components found with this basename: POCBNP,    RADIOLOGIC STUDIES ON ADMISSION: No results found.   ASSESSMENT AND PLAN: Present on Admission:  . ARF (acute renal failure) . Hypercalcemia of malignancy . Metastatic cancer to bone-Adenocarcinoma of unknown primary . Protein-calorie malnutrition, severe . Failure to thrive  . Encephalopathy acute-secondary to narcotics, hypercalcemia, ARF . Hypotension-suspect secondary to hypovolumic shock . Left Lower Leg swelling-presumed Acute DVT . Thrombocytopenia  This unfortunate patient with adenocarcinoma of unknown primary with diffuse osseous metastases with significant pain that has not been responsive to transdermal fentanyl and oral Dilaudid, presents to the ED with hypotension, lethargy and encephalopathy. This MD.had a discussion with the patient's wife at bedside, DO NOT RESUSCITATE was reconfirmed. Patient's family wants to focus on comfort, have explained that patient will be fully transition to comfort measures. We will no longer be monitoring CBC, chemistries, we will not be doing a left lower extremity Doppler to diagnose DVT. Patient is in significant pain, even minimal movement causes him to have worsening of his pain. Family is open to pursuing full comfort measures. Hence will start him on a morphine infusion at 2 mg an hour, goal is to keep him comfortable, family is aware that he may be sedated. Family is open  to residential hospice if patient survives the next few days.  Above noted plan was discussed with patient's spouse-Mary, she was in agreement.   DVT Prophylaxis: Not needed as comfort care  Code Status: DNR  Total time spent for admission equals 45 minutes.  [facility] Pager [phone]  If 7PM-7AM, please contact night-coverage [email or web address] Password [email or web address] 06/02/2014, 7:16 PM  **Disclaimer: This note may have been dictated with voice recognition software.  Similar sounding words can inadvertently be transcribed and this note may contain transcription errors which may not have been corrected upon publication of note.**

## 2014-06-10 NOTE — ED Notes (Signed)
Floor RN unsure if they are able to take Morphine infusion on assigned floor---will d/w RN supervisor ED Charge Nurse aware

## 2014-06-10 NOTE — ED Notes (Signed)
Epi stopped per MD Ghimre (hospitalist)

## 2014-06-10 NOTE — ED Notes (Signed)
Pt turned and repositioned to a more comfortable position. Warm blankets and pillows provided.

## 2014-06-10 NOTE — ED Notes (Signed)
Pt removed non-rebreather. 2NC given for pt comfort.

## 2014-06-10 NOTE — ED Notes (Addendum)
Epi drip still infusing at 72mcg/min. Per MD Vanita Panda continue infusion. This RN continues infusion and awaits  MD order in computer for infusion. MD Vanita Panda reports to this RN that patient is now a DNR and on Comfort measure.

## 2014-06-10 NOTE — ED Notes (Signed)
Bed: RESA Expected date:  Expected time:  Means of arrival:  Comments: EMS, SOB CAPt

## 2014-06-10 NOTE — Progress Notes (Signed)
[facility] MSW note: Pt is a current [facility] patient that lives at home with spouse. Hospice RN went to assess pt this afternoon at home after a decline in condition. Spouse and daughter wanted "everything done" as he was full code at home.  Pt denies pain during visit. MSW and Dr. Vanita Panda met with spouse and daughter to discuss pt's status. Spouse has decided to keep pt comfortable. Spouse has decided to make pt a DNR. Son lives in [place]. Family plans to try to get son here as soon as possible. Spouse has agreed to keep pt in the hospital at this time. However, she may want to try to take pt home soon. MSW offered education and emotional support. Please call with questions or concerns.   Marilynne Halsted, MSW [phone]

## 2014-06-10 NOTE — ED Notes (Signed)
Hospitalist at bedside 

## 2014-06-10 NOTE — ED Provider Notes (Signed)
CSN: [id number]     Arrival date & time 05/30/2014  1647 History   First MD Initiated Contact with Patient 05/27/2014 1650     Chief Complaint  Patient presents with  . Weakness  . Failure To Thrive     HPI  Patient presents in extremis from home via EMS with tachypnea, hypoxia, tachycardia, decreased interactivity. Per report patient was found to be listless by family members. EMS reports that on their arrival the patient was hypoxic, hypotensive, 50/30, minimally interactive. Patient received IV fluids, epinephrine in route with small improvement in his blood pressure, slight improvement in respiratory effort.  There has been minimal change in his interactivity. There is a level V caveat secondary to critical condition.  The patient himself murmurs incomprehensibly, seems to deny pain.   Past Medical History  Diagnosis Date  . Depression   . Anxiety   . Unintentional weight loss   . Diarrhea   . Difficulty urinating   . Arthritis   . Diverticulitis of colon   . Intrinsic asthma     mild   . GERD (gastroesophageal reflux disease)   . Generalized headaches     "monthly" (04/26/2014)  . Metastasis     unknown type/notes 04/26/2014  . Poor appetite     05-01-14 appepetite has improved of late and showing slight weight gain.   Past Surgical History  Procedure Laterality Date  . Laparoscopic gastrotomy w/ repair of ulcer  1979  . Cystoscopy with retrograde pyelogram, ureteroscopy and stent placement Left 01/30/2014    Procedure: CYSTOSCOPY WITH RETROGRADE PYELOGRAM, URETEROSCOPY AND STENT PLACEMENT;  Surgeon: Molli Hazard, MD;  Location: [facility];  Service: Urology;  Laterality: Left;  . Holmium laser application Left 4/97/5300    Procedure: HOLMIUM LASER APPLICATION;  Surgeon: Molli Hazard, MD;  Location: [facility];  Service: Urology;  Laterality: Left;  . Tonsillectomy      "as a kid"  . Eus N/A 05/03/2014    Procedure: UPPER  ENDOSCOPIC ULTRASOUND (EUS) LINEAR;  Surgeon: Milus Banister, MD;  Location: [facility];  Service: Endoscopy;  Laterality: N/A;   Family History  Problem Relation Age of Onset  . Heart disease Mother   . Cancer Sister     unknown type   History  Substance Use Topics  . Smoking status: Former Smoker -- 0.25 packs/day for 10 years    Types: Cigarettes    Quit date: 11/16/1970  . Smokeless tobacco: Never Used  . Alcohol Use: 0.6 oz/week    1 Glasses of wine per week     Comment:  rare use now 05-01-14    Review of Systems  Unable to perform ROS: Acuity of condition      Allergies  Tylenol with codeine #3  Home Medications   Prior to Admission medications   Medication Sig Start Date End Date Taking? Authorizing Provider  allopurinol (ZYLOPRIM) 300 MG tablet Take 300 mg by mouth daily.    Historical Provider, MD  dicyclomine (BENTYL) 10 MG capsule Take 1 tab twice daily as needed for spasms and cramping, diarrhea. 01/25/14   Amy S Esterwood, PA-C  doxepin (SINEQUAN) 50 MG capsule Take 50 mg by mouth at bedtime as needed (Sleep).     Historical Provider, MD  famotidine (PEPCID) 20 MG tablet Take 20 mg by mouth 2 (two) times daily as needed for heartburn.     Historical Provider, MD  fentaNYL (DURAGESIC - DOSED MCG/HR) 25 MCG/HR patch  Place 1 patch (25 mcg total) onto the skin every 3 (three) days. 05/15/14   Wyatt Portela, MD  gabapentin (NEURONTIN) 100 MG capsule Take 100 mg by mouth 4 (four) times daily as needed (Pain). 5 times daily for nerve pain    Historical Provider, MD  HYDROcodone-acetaminophen (NORCO/VICODIN) 5-325 MG per tablet Take 1 tab every 4-6 hours as needed for pain. 05/03/14   Wyatt Portela, MD  HYDROmorphone (DILAUDID) 4 MG tablet Take 1 tablet (4 mg total) by mouth every 4 (four) hours as needed for severe pain. 05/15/14   Wyatt Portela, MD  lactose free nutrition (BOOST PLUS) LIQD Take 237 mLs by mouth 3 (three) times daily with meals. 04/27/14   Joni Reining,  DO  megestrol (MEGACE) 400 MG/10ML suspension Take 10 mLs (400 mg total) by mouth 2 (two) times daily. 05/03/14   Wyatt Portela, MD  megestrol (MEGACE) 400 MG/10ML suspension Take 10 mLs (400 mg total) by mouth 2 (two) times daily. 06/08/14   Wyatt Portela, MD  nystatin (MYCOSTATIN) 100000 UNIT/ML suspension Take 5 mLs (500,000 Units total) by mouth 4 (four) times daily. 06/06/14   Maryanna Shape, NP  omeprazole (PRILOSEC) 20 MG capsule Take 20 mg by mouth daily.    Historical Provider, MD  oxybutynin (DITROPAN) 5 MG tablet Take 1 tablet (5 mg total) by mouth every 6 (six) hours as needed for bladder spasms. 01/30/14   Sharyn Creamer, MD  phenazopyridine (PYRIDIUM) 100 MG tablet Take 1 tablet (100 mg total) by mouth every 8 (eight) hours as needed for pain (Burning urination.  Will turn urine and body fluids orange.). 01/30/14   Sharyn Creamer, MD  rifaximin (XIFAXAN) 550 MG TABS tablet Take 550 mg by mouth 2 (two) times daily as needed (IBS).     Historical Provider, MD  senna-docusate (SENNA S) 8.6-50 MG per tablet Take 1 tablet by mouth 2 (two) times daily. 05/15/14   Wyatt Portela, MD  sildenafil (VIAGRA) 100 MG tablet Take 50 mg by mouth daily as needed for erectile dysfunction.     Historical Provider, MD  tamsulosin (FLOMAX) 0.4 MG CAPS capsule Take 1 capsule (0.4 mg total) by mouth daily. 01/26/14   Martie Lee, PA-C  Testosterone 20.25 MG/ACT (1.62%) GEL Place 2 application onto the skin daily as needed (Rash). Pt uses 2 pumps    Historical Provider, MD  zolpidem (AMBIEN) 10 MG tablet Take 5 mg by mouth at bedtime as needed. sleep    Historical Provider, MD   BP 115/86  Pulse 36  Resp 28  SpO2 90% Physical Exam  Nursing note and vitals reviewed. Constitutional: He appears cachectic. He has a sickly appearance. He appears distressed.  HENT:  Dry mucous membranes, nearly edentulous, no asymmetry of the oropharynx  Eyes: Conjunctivae are normal. Right eye exhibits no discharge.  Left eye exhibits no discharge.  Pupils small, patient does not follow visual commands reliably  Neck: No tracheal deviation present.  Cardiovascular: Regular rhythm.  Tachycardia present.   Pulmonary/Chest: No stridor. Tachypnea noted. He is in respiratory distress. He has decreased breath sounds.  Abdominal: He exhibits no distension.  Musculoskeletal:  The left lower extremity is approximately 3 times greater in size than the right lower extremity with mottled appearance.  Distal pulses are appreciable  Neurological:  Patient moves all extremities spontaneously, has no gross asymmetry of the face, but he does not participate fully any neurologic exam  Psychiatric: His speech is  delayed. He is slowed. Cognition and memory are impaired.    ED Course  Procedures (including critical care time)  On arrival a discussed patient's case with EMS, and the patient was transferred to our gurney, had cardiac monitoring treatment provided, supplemental oxygen provided. Subsequently, reviewed the patient's chart, which is notable for a history of metastatic adenocarcinoma with cessation of therapeutic measures approximately one month ago.  Per oncology the patient was expected to have less than 6 months survival ability. Patient has no diagnosed history of DVT in the past.   Update: I discussed patient's case with hospice, and had a conference with the patient's wife, daughter, hospice workers. We agreed that the patient will be DO NOT RESUSCITATE.  Initial labs notable for lactic acidosis.  The patient had empiric fluids started on arrival, these will be continued.  Subsequently, the patient's labs demonstrate acute kidney failure, with creatinine 3 times greater than recent studies. Patient also has hypercalcemia with calcium of greater than 15. Fluids are continuing, but given the patient's advanced malignancy, resolution of substantial lateral abnormalities seems unlikely.  On repeat exam the  patient is now more awake, he remained tachycardic, less tachypneic.    Labs Review Labs Reviewed  CBC WITH DIFFERENTIAL - Abnormal; Notable for the following:    WBC 10.7 (*)    Hemoglobin 11.6 (*)    HCT 35.1 (*)    Platelets 98 (*)    Basophils Relative 3 (*)    Basophils Absolute 0.3 (*)    All other components within normal limits  COMPREHENSIVE METABOLIC PANEL - Abnormal; Notable for the following:    CO2 17 (*)    Glucose, Bld 186 (*)    BUN 102 (*)    Creatinine, Ser 3.76 (*)    Calcium 15.0 (*)    Albumin 2.4 (*)    AST 40 (*)    Alkaline Phosphatase 960 (*)    Total Bilirubin 1.3 (*)    GFR calc non Af Amer 15 (*)    GFR calc Af Amer 17 (*)    Anion gap 24 (*)    All other components within normal limits  MAGNESIUM - Abnormal; Notable for the following:    Magnesium 3.3 (*)    All other components within normal limits  I-STAT CG4 LACTIC ACID, ED - Abnormal; Notable for the following:    Lactic Acid, Venous 4.99 (*)    All other components within normal limits      EKG Interpretation   Date/Time:  Sunday June 10 2014 16:57:56 EDT Ventricular Rate:  115 PR Interval:  136 QRS Duration: 83 QT Interval:  357 QTC Calculation: 494 R Axis:   80 Text Interpretation:  Sinus tachycardia Ventricular premature complex  Borderline repolarization abnormality ST elevation, consider inferior  injury Borderline prolonged QT interval Baseline wander in lead(s) II III  aVL aVF V4 Sinus tachycardia Artifact Abnormal ekg Confirmed by Carmin Muskrat  MD ([id number]) on 06/03/2014 5:09:13 PM      MDM   Final diagnoses:  Unresponsiveness  Acute renal failure, unspecified acute renal failure type  Hypercalcemia  DVT (deep venous thrombosis), left   This patient presents unresponsive, and on initial exam is in tachycardic, tachypneic, hypoxic, hypotensive, requiring epinephrine drip. Patient's history is notable for a metastatic adenocarcinoma, and given his presentation in  extremis, there is concern for progression of disease. Patient received empiric IV fluids, continue to receive epinephrine here for blood pressure stabilization. Patient's evaluation demonstrates acute kidney failure,  pronounced hypercalcemia. After discussion with family members, while resuscitation was ongoing we made the patient DO NOT RESUSCITATE, and discussed the patient's case with hospice.   CRITICAL CARE Performed by: Carmin Muskrat Total critical care time: 45 Critical care time was exclusive of separately billable procedures and treating other patients. Critical care was necessary to treat or prevent imminent or life-threatening deterioration. Critical care was time spent personally by me on the following activities: development of treatment plan with patient and/or surrogate as well as nursing, discussions with consultants, evaluation of patient's response to treatment, examination of patient, obtaining history from patient or surrogate, ordering and performing treatments and interventions, ordering and review of laboratory studies, ordering and review of radiographic studies, pulse oximetry and re-evaluation of patient's condition.    Carmin Muskrat, MD 06/04/2014 [phone]

## 2014-06-10 NOTE — ED Notes (Addendum)
Pt from home via [facility] c/o failure to thrive. Pt began having agonal respirations. Bone CA Patient. Pt alert but unsure of orientation. 20g left wrist and 18 left AC. 300NS infused. Epi drip started at 72mcg/min at 1625 and 1mg  of Narcan at 1630. Prior to EMS arrival patient had 10mg  of Morphine and 25 mg Fentanyl patch which was removed by [facility]. 12 Lead unremarkable per EMS. Patient respiratory status improved after Narcan administration. t has left lower leg pitting edema that started last pm. Patient denies pain but makes faces when touched or moves. Pt is a new hospice patient  And per hospice and EMS patient family wants all measures taking to sustain life.

## 2014-06-11 ENCOUNTER — Telehealth: Payer: Self-pay | Admitting: Medical Oncology

## 2014-06-11 DIAGNOSIS — R6251 Failure to thrive (child): Secondary | ICD-10-CM

## 2014-06-11 DIAGNOSIS — G9340 Encephalopathy, unspecified: Secondary | ICD-10-CM

## 2014-06-11 MED ORDER — CHLORHEXIDINE GLUCONATE 0.12 % MT SOLN
15.0000 mL | Freq: Two times a day (BID) | OROMUCOSAL | Status: DC
  Filled 2014-06-11 (×5): qty 15

## 2014-06-11 MED ORDER — BIOTENE DRY MOUTH MT LIQD: 15.0000 mL | Freq: Two times a day (BID) | OROMUCOSAL | Status: DC

## 2014-06-12 NOTE — Care Management Note (Signed)
    Page 1 of 1   05/26/2014     11:10:08 AM CARE MANAGEMENT NOTE 05/24/2014  Patient:  Daniel Huynh, Daniel Huynh   Account Number:  [id number]  Date Initiated:  06-21-14  Documentation initiated by:  Sunday Spillers  Subjective/Objective Assessment:   73 yo male admitted with weakness, intractable pain, decreased PO intake. 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   (If response is "NO", the following Medicare IM given date fields will be blank) Date Medicare IM given:   Medicare IM given by:   Date Additional Medicare IM given:   Additional Medicare IM given by:    Discharge Disposition:  EXPIRED  Per UR Regulation:  Reviewed for med. necessity/level of care/duration of stay  If discussed at [place] of Stay Meetings, dates discussed:    Comments:

## 2014-06-12 NOTE — Discharge Summary (Signed)
Physician Discharge Summary  Daniel Huynh QMV:[id number] DOB: 1941-06-11 DOA: 05/17/2014  PCP: Colen Darling, MD  Admit date: 06/09/2014 Discharge date: 07/01/2014  Time spent:30 minutes    Discharge Diagnoses:   Principle problem Failure to thrive  Active Problems:   Metastatic cancer to bone   Protein-calorie malnutrition, severe   Hypercalcemia of malignancy   ARF (acute renal failure)   Encephalopathy acute   Hypotension   Discharge Condition: patient expired on 07/01/14   [facility] Weights   06/05/2014 2100 July 01, 2014 2200  Weight: 47.628 kg (105 lb) 47.628 kg (105 lb)    History of present illness:  73 y.o. 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 .patient had significant deterioration in his overall general health in the past few weeks, however for the past few days he had significantly poor oral intake, and his weakness had worsened. He has significant generalized pain, not  responded well to transdermal fentanyl and oral Dilaudid. , patient was found to be tachypneic, minimally responsive at home and thus  EMS was called, patient was found to be hypotensive with blood pressure of 50/30, hypoxic and minimally reactive. patient was given IV fluids, started on epinephrine infusion in route, and administered Narcan with some improvement in responsiveness. Further workup in the ED revealed hypercalcemia, acute renal failure and thrombocytopenia. Patient also was noted to have significant swelling in his left lower extremity for 1 day. Initially patient was presumed to be a full code, however after discussion with the family by ED M.D., patient was made a DO NOT RESUSCITATE. Patient was  admitted for comfort measures and pain control.   Hospital Course:  Acute encephalopathy with hypertension  Secondary  to dehydration and diffuse osseous metastases of underlying adenocarcinoma of unknown primary. Given recent declining symptoms patient brought to the hospital from home hospice. Patient had significant deterioration in his overall health with poor by mouth intake and significant weakness. Patient hypotensive and hypoxic on presentation, minimally responsive without much improvement in symptoms.  Patient placed on a morphine drip for comfort. Continued Ativan when necessary for anxiety. Continued O2 via nasal cannula.   Adenocarcinoma of unknown primary with diffuse osseous metastasis   Acute kidney injury  Likely secondary to dehydration  Patient pronounced dead around 11:pm on 07/01/2014  Family Communication: spoke with wife and family on July 02, 2023  Consultants:  Hospice Procedures:  None Antibiotics:  None   Discharge Exam: Filed Vitals:   2014/07/01 1641  BP: 68/53  Pulse: 122  Temp: 100.7 F (38.2 C)  Resp: 12                                                                                      Allergies  Allergen Reactions  . Tylenol With Codeine #3 [Acetaminophen-Codeine] Rash      The results of significant diagnostics from this hospitalization (including imaging, microbiology, ancillary and laboratory) are listed below for reference.    Significant Diagnostic Studies: No results found.  Microbiology: No results found for this or any previous visit (from  the past 240 hour(s)).   Labs: Basic Metabolic Panel:  Recent Labs Lab 06/04/2014 1728  NA 139  K 5.2  CL 98  CO2 17*  GLUCOSE 186*  BUN 102*  CREATININE 3.76*  CALCIUM 15.0*  MG 3.3*   Liver Function Tests:  Recent Labs Lab 05/27/2014 1728  AST 40*  ALT 20  ALKPHOS 960*  BILITOT 1.3*  PROT 6.6  ALBUMIN 2.4*   No results found for this basename: LIPASE, AMYLASE,  in the last 168 hours No results found for this basename: AMMONIA,  in the last 168  hours CBC:  Recent Labs Lab 06/03/2014 1728  WBC 10.7*  NEUTROABS 7.0  HGB 11.6*  HCT 35.1*  MCV 82.8  PLT 98*   Cardiac Enzymes: No results found for this basename: CKTOTAL, CKMB, CKMBINDEX, TROPONINI,  in the last 168 hours BNP: BNP (last 3 results)  Recent Labs  04/26/14 1047  PROBNP 93.2   CBG: No results found for this basename: GLUCAP,  in the last 168 hours     Signed:  Malaika Arnall  [facility] 06/02/2014, 4:29 PM

## 2014-06-14 ENCOUNTER — Other Ambulatory Visit: Payer: Medicare Other

## 2014-06-14 ENCOUNTER — Ambulatory Visit: Payer: Medicare Other | Admitting: Physician Assistant

## 2014-06-15 ENCOUNTER — Ambulatory Visit: Payer: Medicare Other | Admitting: Internal Medicine

## 2014-06-16 NOTE — Progress Notes (Signed)
Inpatient RN visit- [facility] [facility]40-[facility] RN Visit-Karen Alford [place] RN  Related admission to [facility] diagnosis of malignant neo of unspecified site.  Pt is DNR code this hospitalization, previously Full Code at home.   Pt seen at bedside, niece and daughter present during visit. Pt with eyes closed, non responsive to touch or voice. Family agrees that patient appears comfortable. RR 10 with 10sec apnea noted. Patient appears to be  actively dying. Pain is being controlled with a continuous morphine infusion at 2mg /hr and a Duragesic patch of 30mcg. No grimace, extremities relaxed. Emotional support offered.  Writer spoke with staff RN Rosaria Ferries, no new concerns voiced. Discussed possibility of need for terminal secretion management, both PRN medication and positioning. None needed at time of visit. Rosaria Ferries voiced understanding and will notify Md if needed.  Comfort cart ordered and delivered during visit.   Patient's home medication list is on shadow chart.   Please call [facility] @ [phone]-  with any hospice needs or at time of death.   Thank you. Tracey Harries, RN  [facility]  Hospice Liaison  [phone])

## 2014-06-16 NOTE — Telephone Encounter (Signed)
Dr Clementeen Graham called to inform Dr. Alen Blew that patient is in hospital @ [facility] and has significantly declined, placed on comfort measures.

## 2014-06-16 NOTE — Progress Notes (Signed)
CSW consulted for assistance with residential hospice placement. PN reviewed. Notes indicate that pt is actively dying. Comfort Care is being provided. Pt is being followed by [facility]. CSW is available to assist with hospice home placement if pt's status changes.  Werner Lean LCSW [phone]

## 2014-06-16 NOTE — Progress Notes (Addendum)
[facility] PROGRESS NOTE  Daniel Huynh MVE:[id number] DOB: [birth year] DOA: 06/04/2014 PCP: Colen Darling, MD  Assessment/Plan: Acute encephalopathy with hypertension Secondary to dehydration and diffuse osseous metastases of underlying adenocarcinoma of unknown primary. Given recent declining symptoms patient brought to the hospital from home hospice. Patient has significant deterioration in his overall health with poor by mouth intake and significant weakness. Patient hypotensive and hypoxic on presentation, minimally responsive without much improvement in symptoms. After discussion with family she has been made DO NOT RESUSCITATE with goal on full comfort and hospice. Patient is on a morphine drip. Continue Ativan when necessary for anxiety. Continue O2 via nasal cannula. Plan on Minimal intervention with goal for full comfort.  Adenocarcinoma of unknown primary with diffuse osseous metastasis pts  oncologist not in office today . Notified his nurse about patient's hospitalization.  Acute kidney injury Likely secondary to dehydration. No Further workup or management   Likely may not survive this hospitalization.   Code Status: DO NOT RESUSCITATE/ comfort measures Family Communication: Wife and family at bedside Disposition Plan: residential  hospice if stable. unlikely to survive this hospitalization   Consultants:  Hospice  Procedures:  None  Antibiotics:  None  HPI/Subjective: Admission H&P reviewed. Patient is somnolent and nonresponsive to command with shallow respiration.  Objective: Filed Vitals:   03-Jul-2014 0638  BP: 66/51  Pulse: 100  Temp: 97.5 F (36.4 C)  Resp: 11    Intake/Output Summary (Last 24 hours) at 07/03/2014 1605 Last data filed at 03-Jul-2014 1400  Gross per 24 hour  Intake    294 ml  Output      0 ml  Net    294 ml   Filed Weights   05/31/2014 2100  Weight: 47.628 kg (105 lb)    Exam:   General: Somnolent and non  responsive to commands with shallow respiration  Cardiovascular: Normal S1 and S2  Respiratory: Diminished breath sounds at lung bases  Abdomen: Soft, nondistended  CNS:: Somnolent and unarousable  Data Reviewed: Basic Metabolic Panel:  Recent Labs Lab 05/21/2014 1728  NA 139  K 5.2  CL 98  CO2 17*  GLUCOSE 186*  BUN 102*  CREATININE 3.76*  CALCIUM 15.0*  MG 3.3*   Liver Function Tests:  Recent Labs Lab 06/01/2014 1728  AST 40*  ALT 20  ALKPHOS 960*  BILITOT 1.3*  PROT 6.6  ALBUMIN 2.4*   No results found for this basename: LIPASE, AMYLASE,  in the last 168 hours No results found for this basename: AMMONIA,  in the last 168 hours CBC:  Recent Labs Lab 05/19/2014 1728  WBC 10.7*  NEUTROABS 7.0  HGB 11.6*  HCT 35.1*  MCV 82.8  PLT 98*   Cardiac Enzymes: No results found for this basename: CKTOTAL, CKMB, CKMBINDEX, TROPONINI,  in the last 168 hours BNP (last 3 results)  Recent Labs  04/26/14 1047  PROBNP 93.2   CBG: No results found for this basename: GLUCAP,  in the last 168 hours  No results found for this or any previous visit (from the past 240 hour(s)).   Studies: No results found.  Scheduled Meds: . antiseptic oral rinse  15 mL Mouth Rinse q12n4p  . chlorhexidine  15 mL Mouth Rinse BID  . fentaNYL  50 mcg Transdermal Q72H  . lactose free nutrition  237 mL Oral TID WC  . megestrol  400 mg Oral BID  . tamsulosin  0.4 mg Oral Daily   Continuous Infusions: . sodium chloride    .  morphine 2 mg/hr (05/17/2014 2200)     Time spent: [place] Pager [phone]. If 7PM-7AM, please contact night-coverage at [email or web address], password [facility] 07-01-2014, 4:05 PM  LOS: 1 day

## 2014-06-16 NOTE — Plan of Care (Signed)
Problem: Phase II Progression Outcomes Goal: Pain within acceptable level for patient Outcome: Completed/Met Date Met:  2014-06-29 Pt unable to verbalize pain scale- pt unresponsive- noobvious facial or bodily indications of pain- morphine drip remains at 6m/hr

## 2014-06-16 NOTE — Progress Notes (Signed)
Pts BP is 66/51. There is order to call if systolic less than 90. Md notified.  Rosie Fate

## 2014-06-16 NOTE — Progress Notes (Signed)
Nutrition Brief Note  Pt screened for low braden and malnutrition screening tool. 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 Per hospice RN note this morning, pt nonresponsive and appears to be actively dying. Nutrition signing off.   Carlis Stable MS, [place], LDN [phone] Pager [phone] Weekend/After Hours Pager

## 2014-06-16 NOTE — Progress Notes (Signed)
[facility] MSW note: This is related hospice admission. Pt is a DNR. Pt did not respond on visit. Pt appeared comfortable. No grimace on his face. Spouse and daughter sitting by the bedside. Spouse able to state today that she feels pt is "fading away". MSW gave family "Gone From My Sight" booklet. MSW educated family on signs of decline. Son plan to arrive from [place] in a few days. MSW encouraged family to hold phone to pt's ear from son for son to have a conversation with pt if desired in case son does not make it in time. MSW offered much emotional support and comfort. MSW provided education. Spouse feels with pt's changes that she desires to keep pt in the hospital rather than taking him home at this time.   Marilynne Halsted, MSW  [phone]

## 2014-06-16 NOTE — Progress Notes (Addendum)
Chaplain paged at 2302 and informed that Mr Daniel Huynh had died of the result of cancer. Chaplain arrived in [place] at 2318. Mr Daniel Huynh's widow and daughter were at the bedside, deep in grief. Soon thereafter friends started arriving to comfort the family.  A son is in [place] and is traveling to [place]. His estimated time of arrival is unknown.  Ms Daniel Huynh reported that she was close to throwing up and nurses provided bags and tubs in case that occurred. Nurses also ensured the comfort cart was stocked with fresh coffee and ice.  Although the family did not wish to respond directly to the question of whether the family would have support from a community of faith, friends assured the chaplain they would be well supported in the long and short term by people of faith.  Chaplain provided grief counsel and prayer to the family.  [facility] answering service was informed of Mr Daniel Huynh's death at 06-21-09.  Please page the chaplain at [phone] if any further spiritual assistance is needed.  Sallee Lange. Jernie Schutt, DMin, MDiv, MA Chaplain

## 2014-06-16 DEATH — deceased

## 2014-07-09 ENCOUNTER — Ambulatory Visit: Payer: Medicare Other | Admitting: Internal Medicine

## 2014-07-17 NOTE — ED Provider Notes (Signed)
Medical screening examination/treatment/procedure(s) were performed by a resident physician or non-physician practitioner and as the supervising physician I was immediately available for consultation/collaboration.  Lynne Leader, MD   Gregor Hams, MD 07/17/14 [phone]

## 2014-08-09 NOTE — ED Provider Notes (Incomplete)
CSN: [id number]     Arrival date & time 04/26/14  8527 History   First MD Initiated Contact with Patient 04/26/14 [phone]     Chief Complaint  Patient presents with  . Chest Pain    ucc transfer   (Consider location/radiation/quality/duration/timing/severity/associated sxs/prior Treatment) HPI  Past Medical History  Diagnosis Date  . Depression   . Anxiety   . Unintentional weight loss   . Diarrhea   . Difficulty urinating   . Arthritis   . Diverticulitis of colon   . Intrinsic asthma     mild   . GERD (gastroesophageal reflux disease)   . Generalized headaches     "monthly" (04/26/2014)  . Metastasis     unknown type/notes 04/26/2014  . Poor appetite     05-01-14 appepetite has improved of late and showing slight weight gain.   Past Surgical History  Procedure Laterality Date  . Laparoscopic gastrotomy w/ repair of ulcer  1979  . Cystoscopy with retrograde pyelogram, ureteroscopy and stent placement Left 01/30/2014    Procedure: CYSTOSCOPY WITH RETROGRADE PYELOGRAM, URETEROSCOPY AND STENT PLACEMENT;  Surgeon: Molli Hazard, MD;  Location: [facility];  Service: Urology;  Laterality: Left;  . Holmium laser application Left 2/35/3614    Procedure: HOLMIUM LASER APPLICATION;  Surgeon: Molli Hazard, MD;  Location: [facility];  Service: Urology;  Laterality: Left;  . Tonsillectomy      "as a kid"  . Eus N/A 05/03/2014    Procedure: UPPER ENDOSCOPIC ULTRASOUND (EUS) LINEAR;  Surgeon: Milus Banister, MD;  Location: [facility];  Service: Endoscopy;  Laterality: N/A;   Family History  Problem Relation Age of Onset  . Heart disease Mother   . Cancer Sister     unknown type   History  Substance Use Topics  . Smoking status: Former Smoker -- 0.25 packs/day for 10 years    Types: Cigarettes    Quit date: 11/16/1970  . Smokeless tobacco: Never Used  . Alcohol Use: 0.6 oz/week    1 Glasses of wine per week     Comment:  rare use now  05-01-14    Review of Systems  Allergies  Tylenol with codeine #3  Home Medications   Prior to Admission medications   Medication Sig Start Date End Date Taking? Authorizing Provider  dicyclomine (BENTYL) 10 MG capsule Take 1 tab twice daily as needed for spasms and cramping, diarrhea. 01/25/14  Yes Amy S Esterwood, PA-C  doxepin (SINEQUAN) 50 MG capsule Take 50 mg by mouth at bedtime as needed (Sleep).    Yes Historical Provider, MD  famotidine (PEPCID) 20 MG tablet Take 20 mg by mouth 2 (two) times daily as needed for heartburn.    Yes Historical Provider, MD  gabapentin (NEURONTIN) 100 MG capsule Take 100 mg by mouth 4 (four) times daily as needed (Pain). 5 times daily for nerve pain   Yes Historical Provider, MD  oxybutynin (DITROPAN) 5 MG tablet Take 1 tablet (5 mg total) by mouth every 6 (six) hours as needed for bladder spasms. 01/30/14  Yes Sharyn Creamer, MD  phenazopyridine (PYRIDIUM) 100 MG tablet Take 1 tablet (100 mg total) by mouth every 8 (eight) hours as needed for pain (Burning urination.  Will turn urine and body fluids orange.). 01/30/14  Yes Sharyn Creamer, MD  rifaximin (XIFAXAN) 550 MG TABS tablet Take 550 mg by mouth 2 (two) times daily as needed (IBS).    Yes Historical Provider, MD  sildenafil (VIAGRA) 100 MG tablet Take 50 mg by mouth daily as needed for erectile dysfunction.    Yes Historical Provider, MD  tamsulosin (FLOMAX) 0.4 MG CAPS capsule Take 1 capsule (0.4 mg total) by mouth daily. 01/26/14  Yes Martie Lee, PA-C  Testosterone 20.25 MG/ACT (1.62%) GEL Place 2 application onto the skin daily as needed (Rash). Pt uses 2 pumps   Yes Historical Provider, MD  zolpidem (AMBIEN) 10 MG tablet Take 5 mg by mouth at bedtime as needed. sleep   Yes Historical Provider, MD  allopurinol (ZYLOPRIM) 300 MG tablet Take 300 mg by mouth daily.    Historical Provider, MD  fentaNYL (DURAGESIC - DOSED MCG/HR) 25 MCG/HR patch Place 1 patch (25 mcg total) onto the skin every 3  (three) days. 05/15/14   Wyatt Portela, MD  HYDROcodone-acetaminophen (NORCO/VICODIN) 5-325 MG per tablet Take 1 tab every 4-6 hours as needed for pain. 05/03/14   Wyatt Portela, MD  HYDROmorphone (DILAUDID) 4 MG tablet Take 1 tablet (4 mg total) by mouth every 4 (four) hours as needed for severe pain. 05/15/14   Wyatt Portela, MD  lactose free nutrition (BOOST PLUS) LIQD Take 237 mLs by mouth 3 (three) times daily with meals. 04/27/14   Lucious Groves, DO  megestrol (MEGACE) 400 MG/10ML suspension Take 10 mLs (400 mg total) by mouth 2 (two) times daily. 05/03/14   Wyatt Portela, MD  megestrol (MEGACE) 400 MG/10ML suspension Take 10 mLs (400 mg total) by mouth 2 (two) times daily. 06/08/14   Wyatt Portela, MD  nystatin (MYCOSTATIN) 100000 UNIT/ML suspension Take 5 mLs (500,000 Units total) by mouth 4 (four) times daily. 06/06/14   Maryanna Shape, NP  omeprazole (PRILOSEC) 20 MG capsule Take 20 mg by mouth daily.    Historical Provider, MD  senna-docusate (SENNA S) 8.6-50 MG per tablet Take 1 tablet by mouth 2 (two) times daily. 05/15/14   Wyatt Portela, MD   BP 123/79  Pulse 81  Temp(Src) 98.6 F (37 C) (Oral)  Resp 16  Ht 5\' 11"  (1.803 m)  Wt 126 lb 4.8 oz (57.289 kg)  BMI 17.62 kg/m2  SpO2 97% Physical Exam  ED Course  Procedures (including critical care time) Labs Review Labs Reviewed  COMPREHENSIVE METABOLIC PANEL - Abnormal; Notable for the following:    Glucose, Bld 123 (*)    Creatinine, Ser 1.56 (*)    Calcium 12.7 (*)    Alkaline Phosphatase 290 (*)    GFR calc non Af Amer 43 (*)    GFR calc Af Amer 50 (*)    All other components within normal limits  PTH, INTACT AND CALCIUM - Abnormal; Notable for the following:    PTH 7.4 (*)    Calcium, Total (PTH) 11.2 (*)    All other components within normal limits  CBC - Abnormal; Notable for the following:    Hemoglobin 12.8 (*)    All other components within normal limits  CREATININE, SERUM - Abnormal; Notable for the  following:    Creatinine, Ser 1.55 (*)    GFR calc non Af Amer 43 (*)    GFR calc Af Amer 50 (*)    All other components within normal limits  BASIC METABOLIC PANEL - Abnormal; Notable for the following:    Creatinine, Ser 1.45 (*)    Calcium 12.0 (*)    GFR calc non Af Amer 47 (*)    GFR calc Af Amer 54 (*)  All other components within normal limits  CBC WITH DIFFERENTIAL  PRO B NATRIURETIC PEPTIDE  TROPONIN I  TROPONIN I  TROPONIN I  I-STAT TROPOININ, ED    Imaging Review No results found.   MDM   1. Cancer, metastatic to bone   2. Pericardial effusion   3. Pleural effusion   4. SOB (shortness of breath)   5. Chest pain    ***

## 2015-04-28 IMAGING — CR DG CHEST 2V
2 series · 2 of 2 positions shown · non-contrast
Comparison: PET-CT 04/20/2014.  Two-view chest x-ray 06/05/2013.

CLINICAL DATA: Left-sided chest pain. Shortness of breath. Current
history of metastatic disease of as yet unknown primary.

EXAM:
CHEST  2 VIEW

[w chest pa]
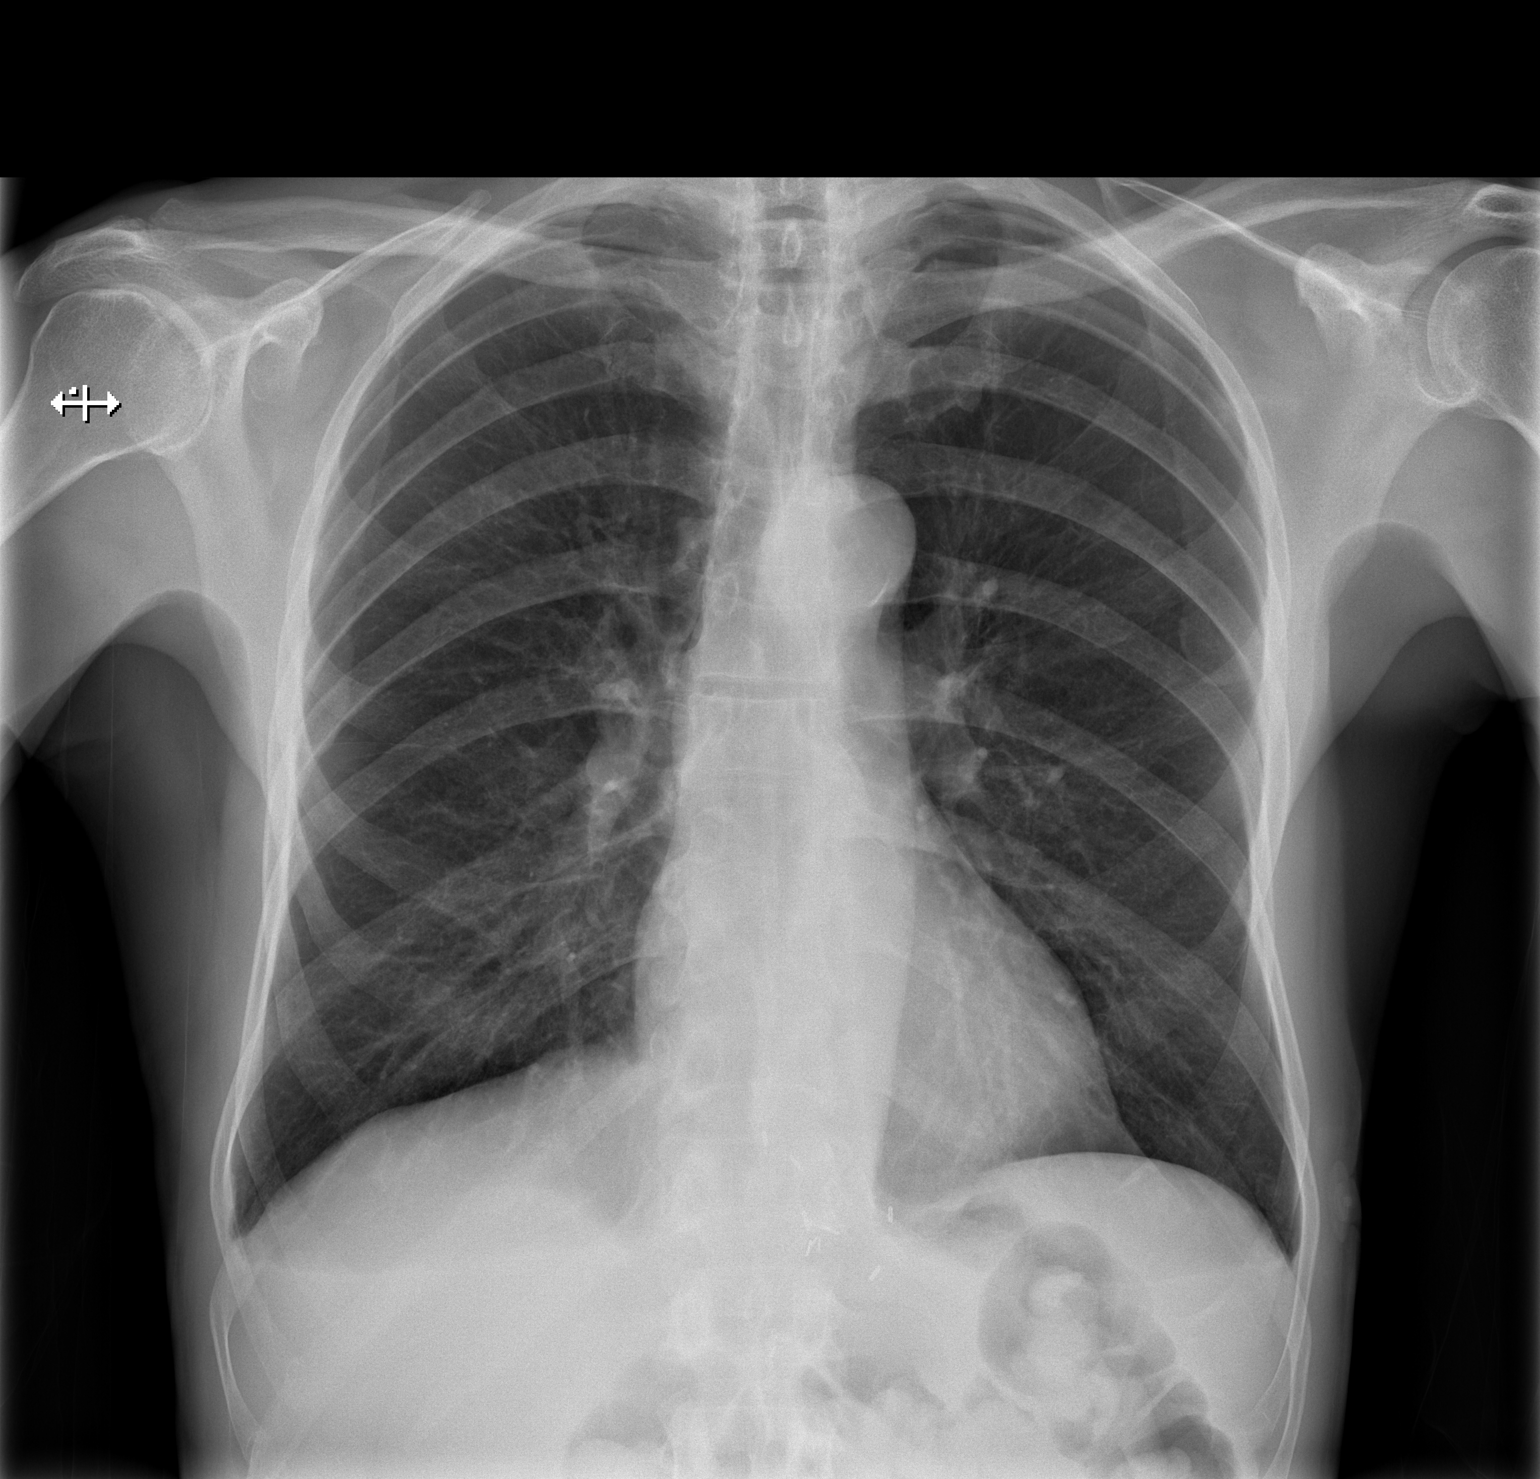

[w chest lat]
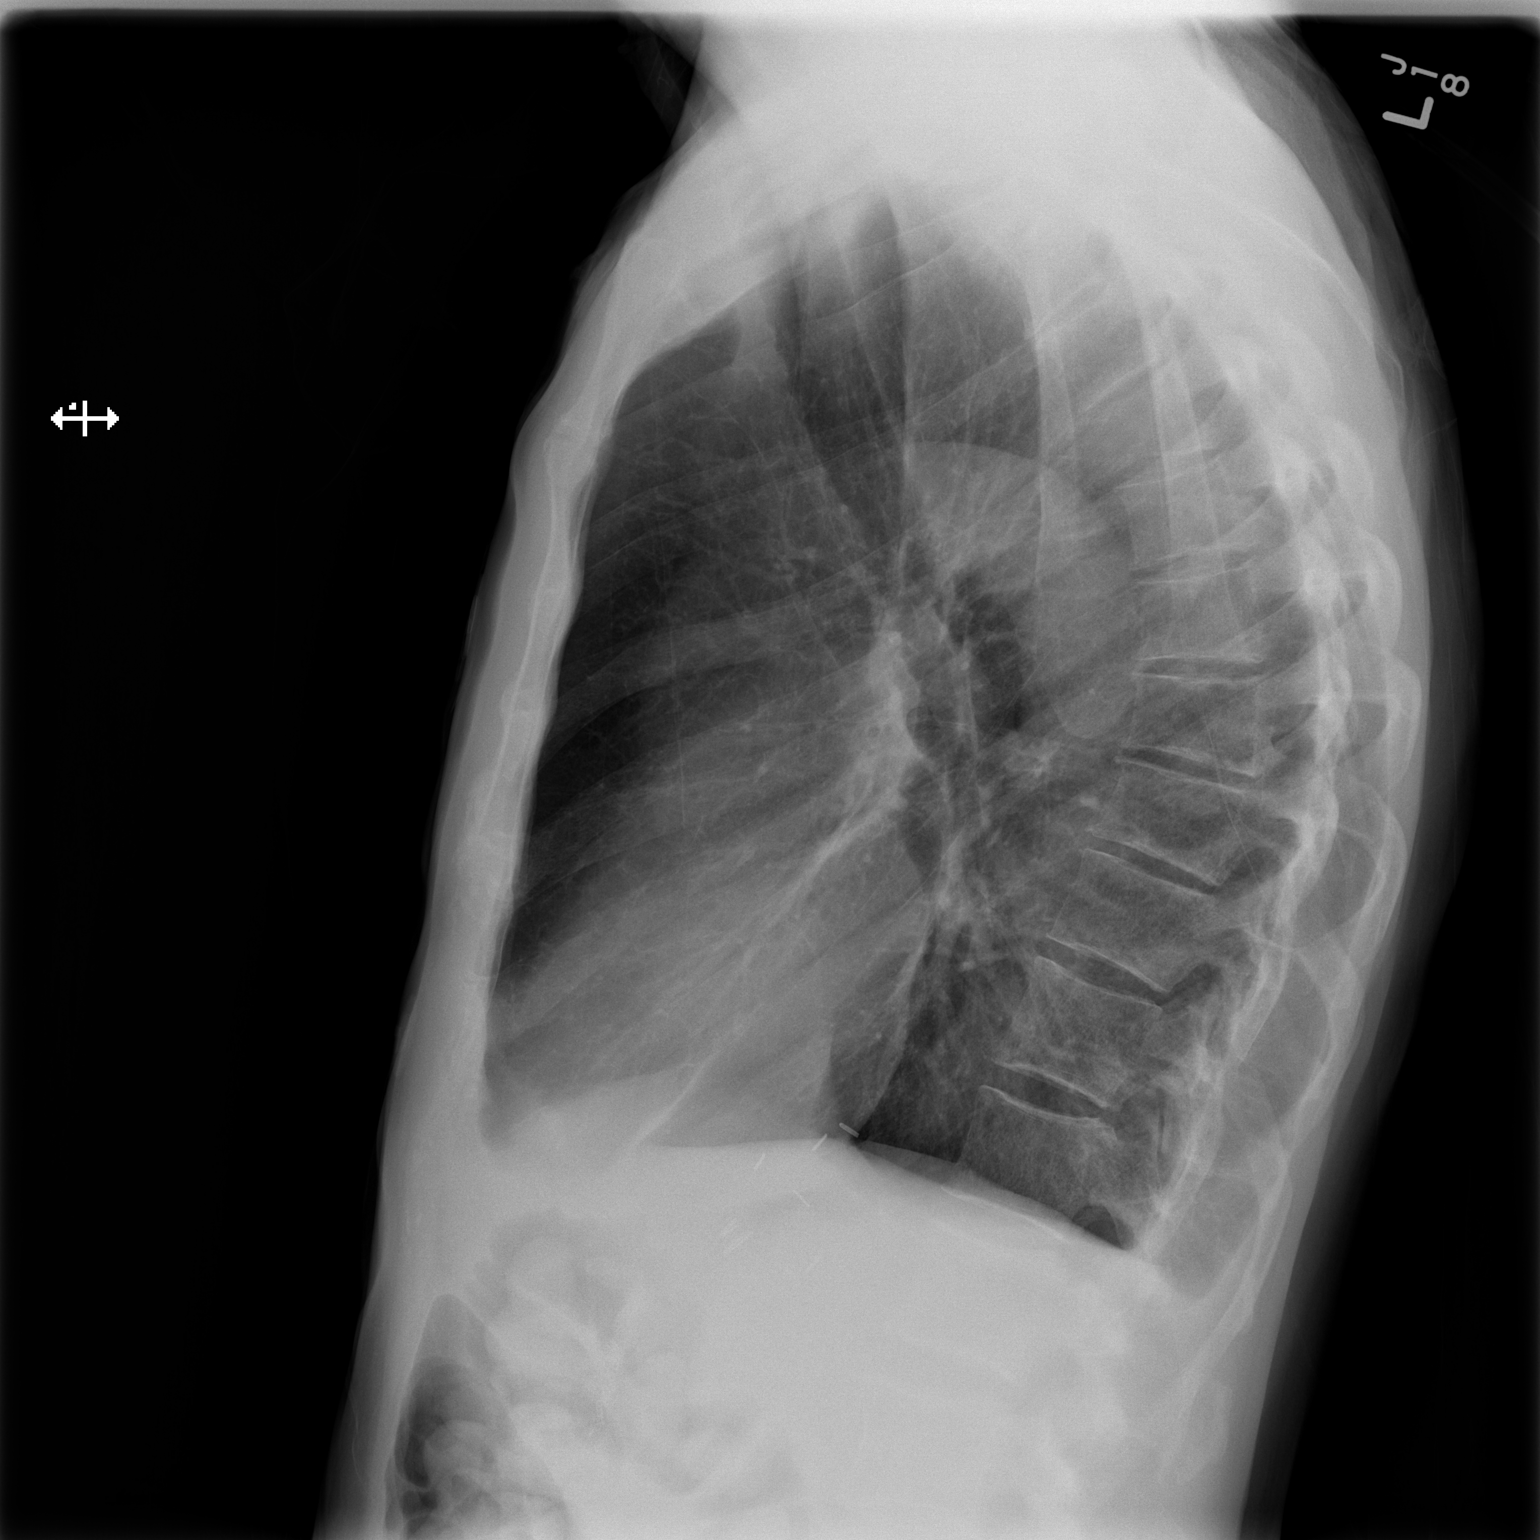

[2 of 2 positions shown; findings below may reference images not displayed]

FINDINGS: Interval development of small bilateral pleural effusions since the
PET-CT 6 days ago. Cardiac silhouette normal in size. Thoracic aorta
minimally atherosclerotic. Hilar and mediastinal contours otherwise
unremarkable. Lungs emphysematous though clear. 3 mm nodule in the
inferior right upper lobe identified on PET-CT not visible on the
chest x-ray. No pneumothorax. Mild degenerative changes involving
the thoracic spine. Lytic lesions involving multiple thoracic
vertebrae, better seen on the PET-CT.
IMPRESSION: 1. Small bilateral pleural effusions, new since the PET-CT 6 days
ago.
2. No acute cardiopulmonary disease otherwise.
3. Osseous metastatic disease, better visualized on the PET-CT.
